# Patient Record
Sex: Male | Born: 1960 | Race: White | Hispanic: No | Smoking: Current every day smoker
Health system: Southern US, Community
[De-identification: ages and names within clinical notes are randomized; demographics above are authoritative.]

## PROBLEM LIST (undated history)

## (undated) DIAGNOSIS — E119 Type 2 diabetes mellitus without complications: Secondary | ICD-10-CM

## (undated) DIAGNOSIS — H269 Unspecified cataract: Secondary | ICD-10-CM

## (undated) DIAGNOSIS — L732 Hidradenitis suppurativa: Secondary | ICD-10-CM

## (undated) DIAGNOSIS — R011 Cardiac murmur, unspecified: Secondary | ICD-10-CM

## (undated) DIAGNOSIS — I10 Essential (primary) hypertension: Secondary | ICD-10-CM

## (undated) DIAGNOSIS — G4730 Sleep apnea, unspecified: Secondary | ICD-10-CM

## (undated) HISTORY — DX: Hidradenitis suppurativa: L73.2

## (undated) HISTORY — DX: Cardiac murmur, unspecified: R01.1

## (undated) HISTORY — DX: Sleep apnea, unspecified: G47.30

## (undated) HISTORY — PX: CARDIAC CATHETERIZATION: SHX172

## (undated) HISTORY — DX: Unspecified cataract: H26.9

---

## 2001-11-04 ENCOUNTER — Emergency Department (HOSPITAL_COMMUNITY): Admission: EM | Admit: 2001-11-04 | Discharge: 2001-11-05 | Payer: Self-pay | Admitting: Emergency Medicine

## 2001-11-05 ENCOUNTER — Encounter: Payer: Self-pay | Admitting: Emergency Medicine

## 2004-06-24 ENCOUNTER — Inpatient Hospital Stay (HOSPITAL_COMMUNITY): Admission: EM | Admit: 2004-06-24 | Discharge: 2004-06-24 | Payer: Self-pay | Admitting: Emergency Medicine

## 2006-04-04 ENCOUNTER — Emergency Department (HOSPITAL_COMMUNITY): Admission: EM | Admit: 2006-04-04 | Discharge: 2006-04-04 | Payer: Self-pay | Admitting: Emergency Medicine

## 2007-01-18 IMAGING — CR DG NECK SOFT TISSUE
2 series · 2 of 2 positions shown · non-contrast
Comparison: none

CLINICAL DATA: Left jaw pain and swelling.
 SOFT TISSUE NECK ? 2 VIEWS:

[w soft tissue neck * (1 of 2)]
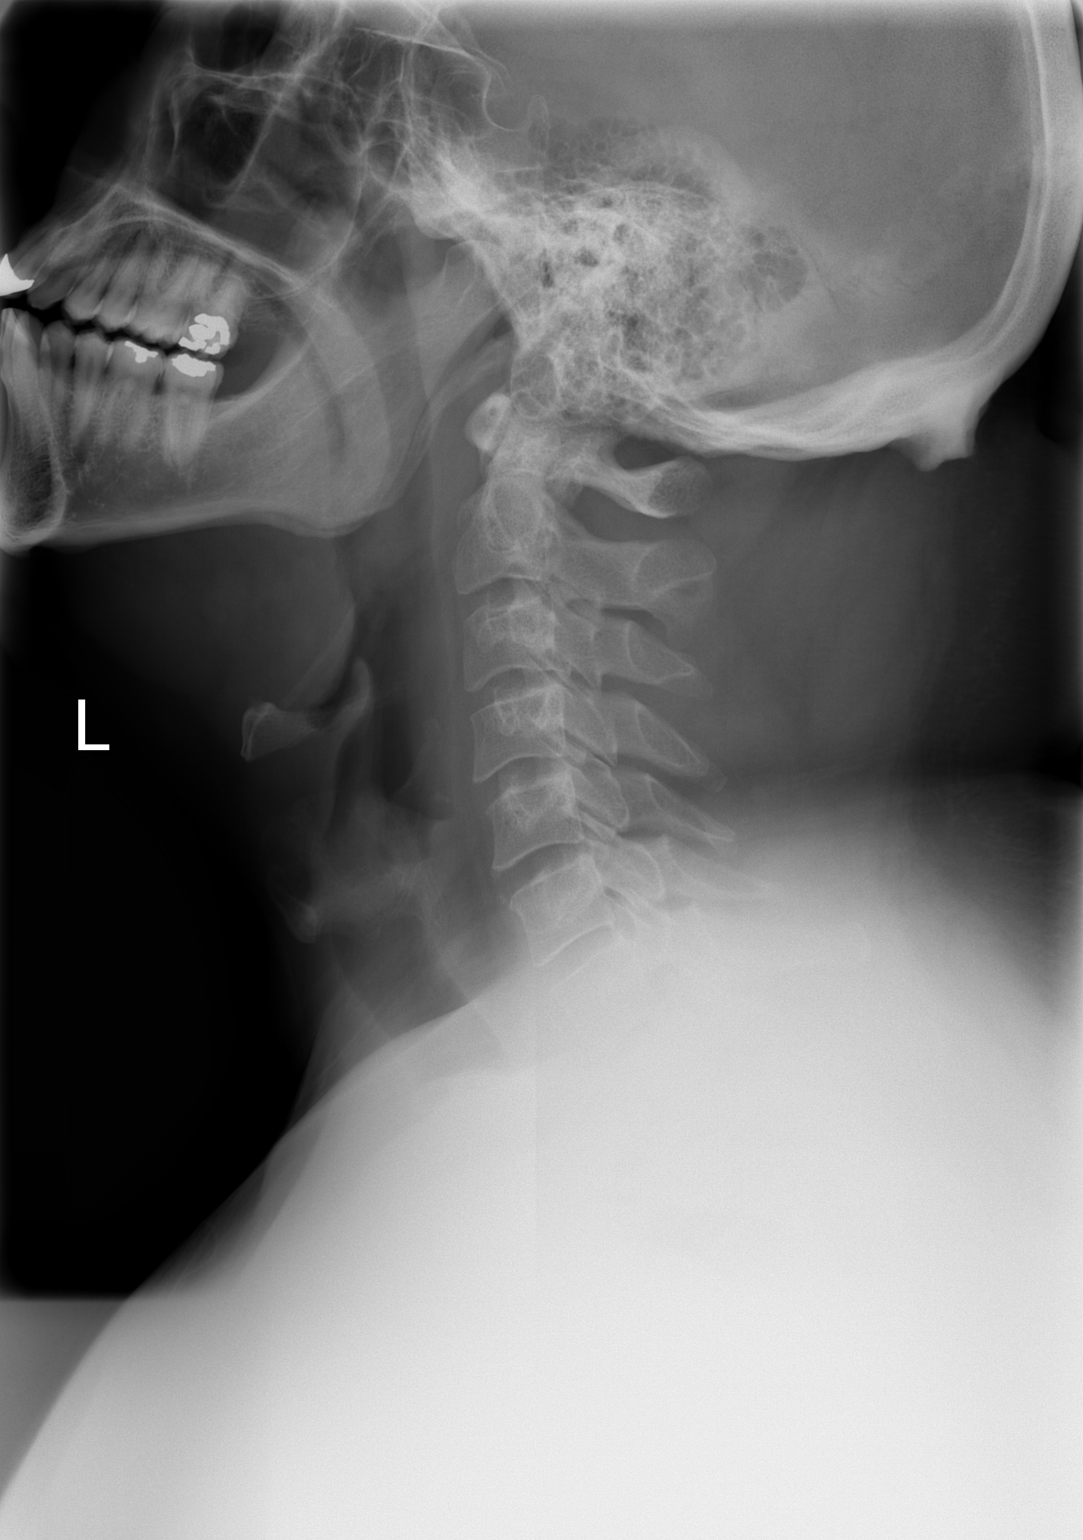

[w soft tissue neck * (2 of 2)]
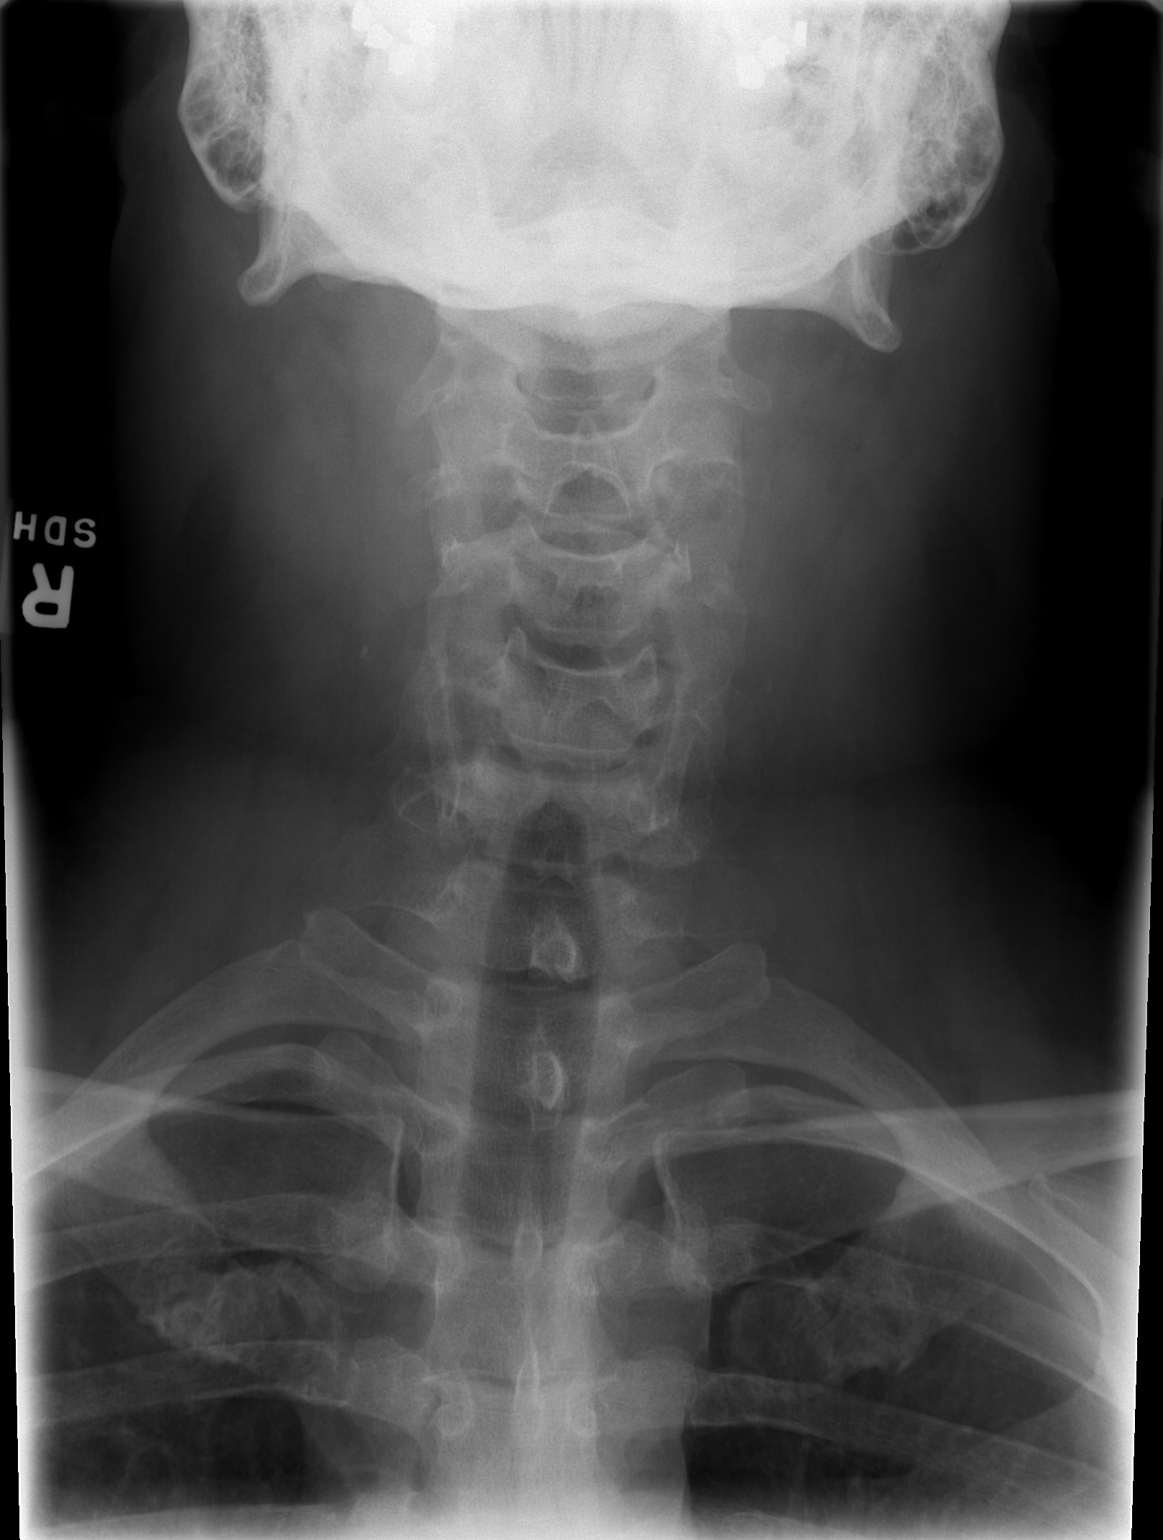

[2 of 2 positions shown; findings below may reference images not displayed]

FINDINGS: The lateral radiograph of the mandible is normal.  The oropharynx airway is patent.  The prevertebral soft tissues are within normal limits.
IMPRESSION: Normal soft tissue radiographs of neck.

## 2007-12-10 ENCOUNTER — Emergency Department (HOSPITAL_COMMUNITY): Admission: EM | Admit: 2007-12-10 | Discharge: 2007-12-10 | Payer: Self-pay | Admitting: Family Medicine

## 2011-05-08 NOTE — H&P (Signed)
NAMERAYSHAWN, MANEY                    ACCOUNT NO.:  [id number]   MEDICAL RECORD NO.:  [id number]                   PATIENT TYPE:  INP   LOCATION:  2002                                 FACILITY:  [facility]   PHYSICIAN:  Merlene Laughter. Renae Gloss, M.D.           DATE OF BIRTH:  [birth year]   DATE OF ADMISSION:  06/23/2004  DATE OF DISCHARGE:                                HISTORY & PHYSICAL   CHIEF COMPLAINT:  Chest pain.   HISTORY OF PRESENT ILLNESS:  Mr. Vultaggio is a 50 year old gentleman who  presents with a several-hour history of progressively worsening chest pain  which he describes as mid sternal and radiating to the groin.  He also  complains of diaphoresis and shortness of breath.  He denies palpitations,  nausea, vomiting.  He has no other acute constitutional or systemic  complaints at this time.   ALLERGIES:  No known drug allergies.   MEDICATIONS:  1. Lipitor 40 mg p.o. daily.  2. Restoril 40 mg p.o. q.h.s. p.r.n.  3. Celexa 40 mg p.o. daily.  4. Lotrel 5/10 p.o. daily.  5. Amaryl 4 mg p.o. daily.  6. Meclizine 25 mg p.r.n.   PAST MEDICAL HISTORY:  1. Type 2 diabetes mellitus.  2. Hypertension.  3. Hyperlipidemia.  4. Depression.  5. Insomnia.   SOCIAL HISTORY:  Mr. Tanori is married. 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  He  does report smoking 2 pack of cigarettes a day.  He denies alcohol or drug  abuse.   FAMILY HISTORY:  Significant for aortic aneurysm.   REVIEW OF SYSTEMS:  As per history of present illness and patient's history  assessment.   PHYSICAL EXAMINATION:  GENERAL:  well-developed, well-nourished white male  in no acute distress.  VITAL SIGNS:  Blood pressure 140/72, temperature 98.3, pulse 74,  respirations 24, O2 saturations on room air 100%.  HEENT:  Tympanic membranes within normal limits bilaterally.  No  oropharyngeal lesions.  NECK:  Supple, no masses, tenderness, no carotid bruits.  LUNGS:  Clear to auscultation bilaterally.  HEART:   S1, S2 regular rate and rhythm, no murmurs, rubs, or gallops.  ABDOMEN:  Soft, nontender, nondistended, positive bowel sounds.  EXTREMITIES:  No clubbing, cyanosis or edema.  NEUROLOGIC:  Alert and oriented x3.  Cranial nerves intact.   ASSESSMENT AND PLAN:  1. Chest pain.  Given Mr. Korus's cardiac risk factors including     hypertension, diabetes, and hyperlipidemia, he will be admitted to rule     out for cardiac etiology.  His first set of cardiac enzymes is negative.     Other possible etiologies would include acid peptic disease.   1. Hypertension, type 2 diabetes mellitus, and hyperlipidemia.  Will     continue current regimen at this time, these problems appear to be     stable.  Merlene Laughter. Renae Gloss, M.D.    KRS/MEDQ  D:  06/24/2004  T:  06/24/2004  Job:  [id number]

## 2011-05-08 NOTE — Cardiovascular Report (Signed)
NAMEGUERIN, Stephen                    ACCOUNT NO.:  [id number]   MEDICAL RECORD NO.:  [id number]                   PATIENT TYPE:  INP   LOCATION:  2099                                 FACILITY:  [facility]   PHYSICIAN:  Madaline Savage, M.D.             DATE OF BIRTH:  Oct 24, 1961   DATE OF PROCEDURE:  06/24/2004  DATE OF DISCHARGE:                              CARDIAC CATHETERIZATION   PROCEDURES PERFORMED:  1. Selective coronary angiography by Judkins technique.  2. Retrograde left heart catheterization.  3. Left ventricular angiography.  4. Abdominal aortography.  5. Successful Angioseal right femoral artery percutaneous closure.   COMPLICATIONS:  None.   ENTRY SITE:  Right femoral.   DYE USED:  Omnipaque.   PATIENT PROFILE:  The patient is a 50 year old gentleman with chest pain  sounding anginal that caused his presentation and subsequent admission to  [facility] in the early morning hours of June 24, 2004.  A  cardiology consult was obtained and we identified at least five risk factors  for having obstructive coronary disease, which included diabetes type 2,  hypertension, hyperlipidemia, obstructive sleep apnea, and tobacco use.  Because of the patient's family history with mother, grandmother,  grandfather all having obstructive coronary disease and myocardial  infarction, it was decided to perform cardiac catheterization as a  definitive evaluation of his chest pain.  This was performed electively on  the afternoon of June 24, 2004, at approximately 4:20 p.m., and was performed  without complications.  The results are listed below.   RESULTS:   PRESSURES:  1. Left ventricular pressure was 125/12, end-diastolic pressure 23.  2. Central aortic pressure 125/85, mean of 105.  3. No aortic valve gradient by pullback technique.   ANGIOGRAPHIC RESULTS:  1. The left main coronary artery is normal.  2. The left anterior descending coronary artery courses to  the cardiac apex,     giving rise to a septal perforator branch, followed by a first diagonal     and then a second diagonal further down the vessel.  No lesions were seen     in the entirety of the LAD or its branches.  3. The left circumflex was nondominant.  It was comprised of an intermediate     ramus branch coming off the left main coronary artery, which was normal,     and the circumflex gave rise to an atrial circumflex branch, then an     obtuse marginal branch, and terminated as a small posterolateral branch.     No lesions were seen in the entirety of the left circumflex system or its     branches.  4. The right coronary artery had an inferior takeoff and was large and     dominant.  It gave rise to a medium-sized posterior descending branch and     a large-size posterolateral branch, and all vessels in the RCA system     were normal.  5.  Left ventricular angiography in an RAO projection 30 degrees showed a     normally-contracting left ventricular chamber with no wall motion     abnormalities, no evidence of mitral regurgitation, and an ejection     fraction estimate of 60%.  No LV thrombus was seen.  6. Abdominal aortography showed both renal arteries to be single and normal.     The abdominal aorta was smooth without any aneurysmal dilatation and the     common iliacs appeared normal as well.  After a 32-degree RAO angiogram     of the right femoral artery showing good entry into the vessel, an     Angioseal Perclose arteriotomy was performed without incidents, with     excellent hemostasis.   The patient tolerated this procedure very well after being given Versed 1 mg  IV and 25 mcg of fentanyl for sedation.  The results as listed above were  favorable to the patient, and he exited the catheterization lab #5 with  stable vital signs and was alert and oriented.   FINAL DIAGNOSES:  1. Recent chest pain with multiple cardiac risk factors.  2. Angiographically patent  coronary arteries.  3. Normal left ventricular systolic function, ejection fraction 60%.  4. Normal renal arteries.  5. Normal abdominal aorta.  6. Successful right percutaneous Angioseal closure.   PLAN:  Discharge in two hours according to Dr. Loney Laurence direction.  See [place]  again in a week or two for a groin check.  Follow up with Dr. Kern Reap.                                               Madaline Savage, M.D.    WHG/MEDQ  D:  06/24/2004  T:  06/25/2004  Job:  [id number]   cc:   Olene Craven, M.D.  [address]  [address]  [place]  [place] [address]  Fax: [phone]   [facility]

## 2015-04-14 ENCOUNTER — Emergency Department (INDEPENDENT_AMBULATORY_CARE_PROVIDER_SITE_OTHER)
Admission: EM | Admit: 2015-04-14 | Discharge: 2015-04-14 | Disposition: A | Discharge status: Nursing Home (Includes ICF) | Source: Home / Self Care | Attending: Emergency Medicine | Admitting: Emergency Medicine

## 2015-04-14 ENCOUNTER — Encounter (HOSPITAL_COMMUNITY): Payer: Self-pay | Admitting: *Deleted

## 2015-04-14 ENCOUNTER — Encounter (HOSPITAL_COMMUNITY): Payer: Self-pay

## 2015-04-14 ENCOUNTER — Emergency Department (HOSPITAL_COMMUNITY)

## 2015-04-14 ENCOUNTER — Emergency Department (HOSPITAL_COMMUNITY)
Admission: EM | Admit: 2015-04-14 | Discharge: 2015-04-14 | Disposition: A | Discharge status: Home / Self Care | Attending: Emergency Medicine | Admitting: Emergency Medicine

## 2015-04-14 DIAGNOSIS — Z792 Long term (current) use of antibiotics: Secondary | ICD-10-CM | POA: Insufficient documentation

## 2015-04-14 DIAGNOSIS — Z794 Long term (current) use of insulin: Secondary | ICD-10-CM | POA: Insufficient documentation

## 2015-04-14 DIAGNOSIS — E119 Type 2 diabetes mellitus without complications: Secondary | ICD-10-CM | POA: Insufficient documentation

## 2015-04-14 DIAGNOSIS — L0291 Cutaneous abscess, unspecified: Secondary | ICD-10-CM

## 2015-04-14 DIAGNOSIS — L0231 Cutaneous abscess of buttock: Secondary | ICD-10-CM | POA: Diagnosis not present

## 2015-04-14 DIAGNOSIS — Z79899 Other long term (current) drug therapy: Secondary | ICD-10-CM | POA: Diagnosis not present

## 2015-04-14 DIAGNOSIS — I10 Essential (primary) hypertension: Secondary | ICD-10-CM | POA: Insufficient documentation

## 2015-04-14 DIAGNOSIS — L03317 Cellulitis of buttock: Secondary | ICD-10-CM

## 2015-04-14 DIAGNOSIS — Z9889 Other specified postprocedural states: Secondary | ICD-10-CM | POA: Insufficient documentation

## 2015-04-14 HISTORY — DX: Type 2 diabetes mellitus without complications: E11.9

## 2015-04-14 HISTORY — DX: Essential (primary) hypertension: I10

## 2015-04-14 LAB — COMPREHENSIVE METABOLIC PANEL
ALT: 18 U/L (ref 0–53)
AST: 15 U/L (ref 0–37)
Albumin: 3.6 g/dL (ref 3.5–5.2)
Alkaline Phosphatase: 77 U/L (ref 39–117)
Anion gap: 11 (ref 5–15)
BILIRUBIN TOTAL: 1.6 mg/dL — AB (ref 0.3–1.2)
BUN: 11 mg/dL (ref 6–23)
CALCIUM: 8.9 mg/dL (ref 8.4–10.5)
CHLORIDE: 99 mmol/L (ref 96–112)
CO2: 22 mmol/L (ref 19–32)
Creatinine, Ser: 0.98 mg/dL (ref 0.50–1.35)
Glucose, Bld: 195 mg/dL — ABNORMAL HIGH (ref 70–99)
Potassium: 3.8 mmol/L (ref 3.5–5.1)
Sodium: 132 mmol/L — ABNORMAL LOW (ref 135–145)
Total Protein: 7.5 g/dL (ref 6.0–8.3)

## 2015-04-14 LAB — CBC WITH DIFFERENTIAL/PLATELET
Basophils Absolute: 0 10*3/uL (ref 0.0–0.1)
Basophils Relative: 0 % (ref 0–1)
EOS ABS: 0 10*3/uL (ref 0.0–0.7)
Eosinophils Relative: 0 % (ref 0–5)
HCT: 45 % (ref 39.0–52.0)
Hemoglobin: 15.4 g/dL (ref 13.0–17.0)
Lymphocytes Relative: 7 % — ABNORMAL LOW (ref 12–46)
Lymphs Abs: 1.1 10*3/uL (ref 0.7–4.0)
MCH: 28.8 pg (ref 26.0–34.0)
MCHC: 34.2 g/dL (ref 30.0–36.0)
MCV: 84.1 fL (ref 78.0–100.0)
MONOS PCT: 10 % (ref 3–12)
Monocytes Absolute: 1.6 10*3/uL — ABNORMAL HIGH (ref 0.1–1.0)
Neutro Abs: 13.6 10*3/uL — ABNORMAL HIGH (ref 1.7–7.7)
Neutrophils Relative %: 83 % — ABNORMAL HIGH (ref 43–77)
PLATELETS: 169 10*3/uL (ref 150–400)
RBC: 5.35 MIL/uL (ref 4.22–5.81)
RDW: 13.3 % (ref 11.5–15.5)
WBC: 16.3 10*3/uL — AB (ref 4.0–10.5)

## 2015-04-14 MED ORDER — LIDOCAINE-EPINEPHRINE 1 %-1:100000 IJ SOLN
10.0000 mL | Freq: Once | INTRAMUSCULAR | Status: AC
  Administered 2015-04-14: 10 mL
  Filled 2015-04-14: qty 1

## 2015-04-14 MED ORDER — LIDOCAINE-EPINEPHRINE (PF) 1 %-1:200000 IJ SOLN: 10.0000 mL | Freq: Once | INTRAMUSCULAR | Status: DC

## 2015-04-14 MED ORDER — CEFAZOLIN SODIUM-DEXTROSE 2-3 GM-% IV SOLR
2.0000 g | Freq: Once | INTRAVENOUS | Status: AC
  Administered 2015-04-14: 2 g via INTRAVENOUS
  Filled 2015-04-14: qty 50

## 2015-04-14 MED ORDER — AMOXICILLIN-POT CLAVULANATE 875-125 MG PO TABS: 1.0000 | ORAL_TABLET | Freq: Two times a day (BID) | ORAL | Status: DC

## 2015-04-14 MED ORDER — HYDROCODONE-ACETAMINOPHEN 5-325 MG PO TABS: 1.0000 | ORAL_TABLET | ORAL | Status: DC | PRN

## 2015-04-14 MED ORDER — ACETAMINOPHEN 325 MG PO TABS
650.0000 mg | ORAL_TABLET | Freq: Four times a day (QID) | ORAL | Status: DC | PRN
  Administered 2015-04-14: 650 mg via ORAL
  Filled 2015-04-14: qty 2

## 2015-04-14 NOTE — Discharge Instructions (Signed)
Soak, and remove the gauze in 24-36 hours. Continue soaks twice per day with gentle massage of the abscess area. Wound recheck in 2-3 days. Recheck here with any worsening symptoms, worsening pain, fever, worsening sugars.  Abscess An abscess is an infected area that contains a collection of pus and debris.It can occur in almost any part of the body. An abscess is also known as a furuncle or boil. CAUSES  An abscess occurs when tissue gets infected. This can occur from blockage of oil or sweat glands, infection of hair follicles, or a minor injury to the skin. As the body tries to fight the infection, pus collects in the area and creates pressure under the skin. This pressure causes pain. People with weakened immune systems have difficulty fighting infections and get certain abscesses more often.  SYMPTOMS Usually an abscess develops on the skin and becomes a painful mass that is red, warm, and tender. If the abscess forms under the skin, you may feel a moveable soft area under the skin. Some abscesses break open (rupture) on their own, but most will continue to get worse without care. The infection can spread deeper into the body and eventually into the bloodstream, causing you to feel ill.  DIAGNOSIS  Your caregiver will take your medical history and perform a physical exam. A sample of fluid may also be taken from the abscess to determine what is causing your infection. TREATMENT  Your caregiver may prescribe antibiotic medicines to fight the infection. However, taking antibiotics alone usually does not cure an abscess. Your caregiver may need to make a small cut (incision) in the abscess to drain the pus. In some cases, gauze is packed into the abscess to reduce pain and to continue draining the area. HOME CARE INSTRUCTIONS   Only take over-the-counter or prescription medicines for pain, discomfort, or fever as directed by your caregiver.  If you were prescribed antibiotics, take them as  directed. Finish them even if you start to feel better.  If gauze is used, follow your caregiver's directions for changing the gauze.  To avoid spreading the infection:  Keep your draining abscess covered with a bandage.  Wash your hands well.  Do not share personal care items, towels, or whirlpools with others.  Avoid skin contact with others.  Keep your skin and clothes clean around the abscess.  Keep all follow-up appointments as directed by your caregiver. SEEK MEDICAL CARE IF:   You have increased pain, swelling, redness, fluid drainage, or bleeding.  You have muscle aches, chills, or a general ill feeling.  You have a fever. MAKE SURE YOU:   Understand these instructions.  Will watch your condition.  Will get help right away if you are not doing well or get worse. Document Released: 09/16/2005 Document Revised: 06/07/2012 Document Reviewed: 02/19/2012 [facility] Patient Information 2015 [place], [place]. This information is not intended to replace advice given to you by your health care provider. Make sure you discuss any questions you have with your health care provider.

## 2015-04-14 NOTE — ED Provider Notes (Signed)
CSN: [id number]     Arrival date & time 04/14/15  1051 History   First MD Initiated Contact with Patient 04/14/15 1317     Chief Complaint  Patient presents with  . Rash   (Consider location/radiation/quality/duration/timing/severity/associated sxs/prior Treatment) HPI  He is a 54 year old man here for evaluation of abscess. He states he has had a pimple on his right buttock for the last 6 months. He will intermittently expressed pus from this pimple. He states he was expressing pus on Friday and he felt it rupture internally. Since then, he has had redness, swelling, tenderness. He has felt feverish. No nausea. He is a diabetic and his blood sugar is starting to go up.  Past Medical History  Diagnosis Date  . Hypertension   . Diabetes mellitus without complication    Past Surgical History  Procedure Laterality Date  . Cardiac catheterization     History reviewed. No pertinent family history. History  Substance Use Topics  . Smoking status: Current Every Day Smoker  . Smokeless tobacco: Not on file  . Alcohol Use: Yes    Review of Systems As in history of present illness Allergies  Review of patient's allergies indicates not on file.  Home Medications   Prior to Admission medications   Medication Sig Start Date End Date Taking? Authorizing Provider  atorvastatin (LIPITOR) 40 MG tablet Take 40 mg by mouth daily.   Yes Historical Provider, MD  Insulin Glargine (LANTUS [place]) Inject into the skin.   Yes Historical Provider, MD  Insulin Lispro, Human, (HUMALOG [place]) Inject into the skin.   Yes Historical Provider, MD  LISINOPRIL PO Take by mouth.   Yes Historical Provider, MD  sitaGLIPtin-metformin (JANUMET) 50-1000 MG per tablet Take 1 tablet by mouth 2 (two) times daily with a meal.   Yes Historical Provider, MD   BP 178/88 mmHg  Pulse 119  Temp(Src) 101.6 F (38.7 C) (Oral)  Resp 16  SpO2 99% Physical Exam  Constitutional: He is oriented to person, place, and time. He  appears well-developed and well-nourished. No distress.  Cardiovascular: Normal rate.   Pulmonary/Chest: Effort normal.  Genitourinary:     Neurological: He is alert and oriented to person, place, and time.  Skin:  Right buttock with erythema and induration extending to the perineum and rectal area. There is a central area of fluctuance.    ED Course  Procedures (including critical care time) Labs Review Labs Reviewed - No data to display  Imaging Review No results found.   MDM   1. Abscess of buttock, right   2. Cellulitis of buttock    With tachycardia and fever, he technically meets criteria for sepsis. He is also a known diabetic and his sugars are going up. He states it was 170 this morning. We'll transfer to [facility] ER via shuttle for additional evaluation and workup. I think he needs at least 1 dose of IV antibiotics.    Melony Overly, MD 04/14/15 1341

## 2015-04-14 NOTE — ED Notes (Signed)
Pt sent from u/c abscess and cellulitis right buttock.  Pt reports onset 6 months pimple right buttock  Pt sometimes expresses pus from pimple and sometimes it will drain on its own.  2 days ago pimple ruptured internally now site red, swollen and tender.  Pt reports fevers.  BS has been increased.

## 2015-04-14 NOTE — ED Notes (Signed)
Pt  Reports   Pain in  Buttock   Rectal  Area   With  Redness   Swelling     Had  Some  Drainage   Earlier     Pain is  Present  As    Well       Pt  Has  A  History   Of  Diabetes     He  Also has  A  Fever  He  States  His  Glucose  Was  Slightly  Elevated  Today  As   Well      -  Pt ambulated  To  Room    He  Is  Awake  And  Alert  And  Oriented

## 2015-04-14 NOTE — ED Provider Notes (Signed)
CSN: [id number]     Arrival date & time 04/14/15  1414 History   First MD Initiated Contact with Patient 04/14/15 1732     Chief Complaint  Patient presents with  . Cellulitis      HPI  Impression presents for evaluation of a buttock abscess. Seen and evaluated urgent care and transferred here. History of diabetes. States her sugars of "out-of-control" they're 170 today. States he intermittently gets drainage from a "boil" on his right buttock. Started draining yesterday but felt like it was "getting bigger inside".  Past Medical History  Diagnosis Date  . Hypertension   . Diabetes mellitus without complication    Past Surgical History  Procedure Laterality Date  . Cardiac catheterization     History reviewed. No pertinent family history. History  Substance Use Topics  . Smoking status: Current Every Day Smoker -- 1.00 packs/day    Types: Cigarettes  . Smokeless tobacco: Not on file  . Alcohol Use: Yes     Comment: occ     Review of Systems  Constitutional: Negative for fever, chills, diaphoresis, appetite change and fatigue.  HENT: Negative for mouth sores, sore throat and trouble swallowing.   Eyes: Negative for visual disturbance.  Respiratory: Negative for cough, chest tightness, shortness of breath and wheezing.   Cardiovascular: Negative for chest pain.  Gastrointestinal: Negative for nausea, vomiting, abdominal pain, diarrhea and abdominal distention.  Endocrine: Negative for polydipsia, polyphagia and polyuria.  Genitourinary: Negative for dysuria, frequency and hematuria.       Right gluteal pain.  Musculoskeletal: Negative for gait problem.  Skin: Negative for color change, pallor and rash.  Neurological: Negative for dizziness, syncope, light-headedness and headaches.  Hematological: Does not bruise/bleed easily.  Psychiatric/Behavioral: Negative for behavioral problems and confusion.      Allergies  Review of patient's allergies indicates no known  allergies.  Home Medications   Prior to Admission medications   Medication Sig Start Date End Date Taking? Authorizing Provider  atorvastatin (LIPITOR) 40 MG tablet Take 40 mg by mouth daily.   Yes Historical Provider, MD  insulin glargine (LANTUS) 100 UNIT/ML injection Inject 25 Units into the skin at bedtime.   Yes Historical Provider, MD  Insulin Lispro, Human, (HUMALOG [place]) Inject 15 units of lipase into the skin See admin instructions. Per sliding scale   Yes Historical Provider, MD  lisinopril (PRINIVIL,ZESTRIL) 20 MG tablet Take 20 mg by mouth daily.   Yes Historical Provider, MD  sitaGLIPtin-metformin (JANUMET) 50-1000 MG per tablet Take 1 tablet by mouth 2 (two) times daily with a meal.   Yes Historical Provider, MD  temazepam (RESTORIL) 15 MG capsule Take 15 mg by mouth at bedtime as needed for sleep.   Yes Historical Provider, MD  amoxicillin-clavulanate (AUGMENTIN) 875-125 MG per tablet Take 1 tablet by mouth 2 (two) times daily. 04/14/15   Tanna Furry, MD  HYDROcodone-acetaminophen (NORCO/VICODIN) 5-325 MG per tablet Take 1 tablet by mouth every 4 (four) hours as needed. 04/14/15   Tanna Furry, MD   BP 131/80 mmHg  Pulse 102  Temp(Src) 98.5 F (36.9 C) (Oral)  Resp 16  Ht 6' (1.829 m)  Wt 247 lb 11.2 oz (112.356 kg)  BMI 33.59 kg/m2  SpO2 96% Physical Exam  Constitutional: He is oriented to person, place, and time. He appears well-developed and well-nourished. No distress.  HENT:  Head: Normocephalic.  Eyes: Conjunctivae are normal. Pupils are equal, round, and reactive to light. No scleral icterus.  Neck: Normal range of  motion. Neck supple. No thyromegaly present.  Cardiovascular: Normal rate and regular rhythm.  Exam reveals no gallop and no friction rub.   No murmur heard. Pulmonary/Chest: Effort normal and breath sounds normal. No respiratory distress. He has no wheezes. He has no rales.  Abdominal: Soft. Bowel sounds are normal. He exhibits no distension. There is no  tenderness. There is no rebound.  Genitourinary:     Musculoskeletal: Normal range of motion.  Neurological: He is alert and oriented to person, place, and time.  Skin: Skin is warm and dry. No rash noted.  Psychiatric: He has a normal mood and affect. His behavior is normal.    ED Course  Procedures (including critical care time) Labs Review Labs Reviewed  CBC WITH DIFFERENTIAL/PLATELET - Abnormal; Notable for the following:    WBC 16.3 (*)    Neutrophils Relative % 83 (*)    Neutro Abs 13.6 (*)    Lymphocytes Relative 7 (*)    Monocytes Absolute 1.6 (*)    All other components within normal limits  COMPREHENSIVE METABOLIC PANEL - Abnormal; Notable for the following:    Sodium 132 (*)    Glucose, Bld 195 (*)    Total Bilirubin 1.6 (*)    All other components within normal limits  CULTURE, BLOOD (ROUTINE X 2)  CULTURE, BLOOD (ROUTINE X 2)  URINE CULTURE  URINALYSIS, ROUTINE W REFLEX MICROSCOPIC    Imaging Review Dg Chest 2 View  04/14/2015   CLINICAL DATA:  Cellulitis  EXAM: CHEST  2 VIEW  COMPARISON:  06/23/2004  FINDINGS: The heart size and mediastinal contours are within normal limits. Both lungs are clear. The visualized skeletal structures are unremarkable.  IMPRESSION: No active cardiopulmonary disease.   Electronically Signed   By: Franchot Gallo M.D.   On: 04/14/2015 16:13     EKG Interpretation None     INCISION AND DRAINAGE Performed by: Lolita Patella Consent: Verbal consent obtained. Risks and benefits: risks, benefits and alternatives were discussed Type: abscess  Body area: Right buttock  Anesthesia: local infiltration  Incision was made with a scalpel.  Local anesthetic: lidocaine 1% with epinephrine  Anesthetic total: 6 ml  Complexity: complex Blunt dissection to break up loculations  Drainage: purulent  Drainage amount: moderate, 10 ml  Packing material: 1/4 in iodoform gauze  Patient tolerance: Patient tolerated the procedure  well with no immediate complications.    MDM   Final diagnoses:  Abscess    Given IV Ancef. Incision and drainage performed. Iodoform gauze placed. Re-dressed. Plan is discharged home. I have asked him to soak, and remove the gauze in 24-48 hours. Recheck here in 2-3 days if not markedly improving. Recheck with any worsening in the interval. Including worsening pain, fevers, uncontrolled sugars.    Tanna Furry, MD 04/14/15 [place]ius

## 2015-04-20 LAB — CULTURE, BLOOD (ROUTINE X 2): Culture: NO GROWTH

## 2015-04-21 LAB — CULTURE, BLOOD (ROUTINE X 2): Culture: NO GROWTH

## 2018-07-25 LAB — LIPID PANEL
CHOLESTEROL: 109 (ref 0–200)
HDL: 27 — AB (ref 35–70)
LDL Cholesterol: 63
LDl/HDL Ratio: 2.3
Triglycerides: 97 (ref 40–160)

## 2018-07-25 LAB — CBC AND DIFFERENTIAL
HCT: 45 (ref 41–53)
Hemoglobin: 15.3 (ref 13.5–17.5)
NEUTROS ABS: 5
Platelets: 215 (ref 150–399)
WBC: 7.7

## 2018-07-25 LAB — HEPATIC FUNCTION PANEL
ALT: 26 (ref 10–40)
AST: 20 (ref 14–40)
Alkaline Phosphatase: 76 (ref 25–125)
Bilirubin, Total: 0.3

## 2018-07-25 LAB — BASIC METABOLIC PANEL
BUN: 12 (ref 4–21)
CREATININE: 0.9 (ref 0.6–1.3)
GLUCOSE: 115
POTASSIUM: 4.5 (ref 3.4–5.3)
SODIUM: 144 (ref 137–147)

## 2018-07-25 LAB — VITAMIN D 25 HYDROXY (VIT D DEFICIENCY, FRACTURES): VIT D 25 HYDROXY: 19.8

## 2018-07-25 LAB — HEMOGLOBIN A1C: HEMOGLOBIN A1C: 6.6

## 2018-07-25 LAB — TSH: TSH: 1.9 (ref 0.41–5.90)

## 2018-10-16 ENCOUNTER — Other Ambulatory Visit: Payer: Self-pay | Admitting: Nurse Practitioner

## 2018-10-26 ENCOUNTER — Encounter: Payer: Self-pay | Admitting: Nurse Practitioner

## 2018-10-26 DIAGNOSIS — E1165 Type 2 diabetes mellitus with hyperglycemia: Secondary | ICD-10-CM

## 2018-10-26 DIAGNOSIS — E559 Vitamin D deficiency, unspecified: Secondary | ICD-10-CM

## 2018-10-26 DIAGNOSIS — E782 Mixed hyperlipidemia: Secondary | ICD-10-CM

## 2018-10-26 DIAGNOSIS — I10 Essential (primary) hypertension: Secondary | ICD-10-CM

## 2018-10-26 DIAGNOSIS — I6529 Occlusion and stenosis of unspecified carotid artery: Secondary | ICD-10-CM

## 2018-10-27 ENCOUNTER — Encounter: Payer: Self-pay | Admitting: Nurse Practitioner

## 2018-10-27 ENCOUNTER — Ambulatory Visit (INDEPENDENT_AMBULATORY_CARE_PROVIDER_SITE_OTHER): Admitting: Nurse Practitioner

## 2018-10-27 VITALS — BP 130/88 | HR 80 | Temp 98.1°F | Ht 72.0 in | Wt 249.2 lb

## 2018-10-27 DIAGNOSIS — E1165 Type 2 diabetes mellitus with hyperglycemia: Secondary | ICD-10-CM

## 2018-10-27 DIAGNOSIS — E782 Mixed hyperlipidemia: Secondary | ICD-10-CM | POA: Diagnosis not present

## 2018-10-27 DIAGNOSIS — I6529 Occlusion and stenosis of unspecified carotid artery: Secondary | ICD-10-CM | POA: Diagnosis not present

## 2018-10-27 DIAGNOSIS — Z23 Encounter for immunization: Secondary | ICD-10-CM | POA: Diagnosis not present

## 2018-10-27 DIAGNOSIS — I10 Essential (primary) hypertension: Secondary | ICD-10-CM | POA: Diagnosis not present

## 2018-10-27 DIAGNOSIS — Z794 Long term (current) use of insulin: Secondary | ICD-10-CM

## 2018-10-27 LAB — LIPID PANEL
CHOL/HDL RATIO: 3.4 ratio (ref 0.0–5.0)
CHOLESTEROL TOTAL: 93 mg/dL — AB (ref 100–199)
HDL: 27 mg/dL — ABNORMAL LOW (ref >39)
LDL CALC: 52 mg/dL (ref 0–99)
Triglycerides: 69 mg/dL (ref 0–149)
VLDL Cholesterol Cal: 14 mg/dL (ref 5–40)

## 2018-10-27 LAB — CMP14 + ANION GAP
ALT: 17 IU/L (ref 0–44)
AST: 13 IU/L (ref 0–40)
Albumin/Globulin Ratio: 1.6 (ref 1.2–2.2)
Albumin: 4.2 g/dL (ref 3.5–5.5)
Alkaline Phosphatase: 78 IU/L (ref 39–117)
Anion Gap: 14 mmol/L (ref 10.0–18.0)
BILIRUBIN TOTAL: 0.4 mg/dL (ref 0.0–1.2)
BUN/Creatinine Ratio: 9 (ref 9–20)
BUN: 9 mg/dL (ref 6–24)
CHLORIDE: 107 mmol/L — AB (ref 96–106)
CO2: 20 mmol/L (ref 20–29)
Calcium: 8.9 mg/dL (ref 8.7–10.2)
Creatinine, Ser: 1.03 mg/dL (ref 0.76–1.27)
GFR calc Af Amer: 92 mL/min/{1.73_m2} (ref >59)
GFR calc non Af Amer: 80 mL/min/{1.73_m2} (ref >59)
GLUCOSE: 75 mg/dL (ref 65–99)
Globulin, Total: 2.6 g/dL (ref 1.5–4.5)
Potassium: 4.5 mmol/L (ref 3.5–5.2)
Sodium: 141 mmol/L (ref 134–144)
TOTAL PROTEIN: 6.8 g/dL (ref 6.0–8.5)

## 2018-10-27 LAB — HEMOGLOBIN A1C
ESTIMATED AVERAGE GLUCOSE: 126 mg/dL
Hgb A1c MFr Bld: 6 % — ABNORMAL HIGH (ref 4.8–5.6)

## 2018-10-27 MED ORDER — TEMAZEPAM 15 MG PO CAPS: 15.0000 mg | ORAL_CAPSULE | Freq: Every evening | ORAL | 3 refills | Status: DC | PRN

## 2018-10-27 NOTE — Progress Notes (Addendum)
Subjective:     Patient ID: Stephen Sampson , male    DOB: 1961-04-19 , 57 y.o.   MRN: [id number]   Chief Complaint  Patient presents with  . Diabetes    HPI  Diabetes  He presents for his follow-up diabetic visit. He has type 2 diabetes mellitus. His disease course has been stable. Pertinent negatives for hypoglycemia include no dizziness or headaches. Pertinent negatives for diabetes include no chest pain, no polydipsia, no polyphagia and no polyuria. Symptoms are stable. Current diabetic treatment includes oral agent (dual therapy). He is compliant with treatment all of the time. His weight is stable. He is following a generally healthy diet. He participates in exercise daily. His home blood glucose trend is decreasing steadily. His breakfast blood glucose range is generally 70-90 mg/dl. An ACE inhibitor/angiotensin II receptor blocker is being taken. Eye exam is current.     Past Medical History:  Diagnosis Date  . Diabetes mellitus without complication ([place])   . Hypertension      No family history on file.   Current Outpatient Medications:  .  atorvastatin (LIPITOR) 40 MG tablet, TAKE 1 TABLET EVERY EVENING, Disp: 90 tablet, Rfl: 1 .  Dulaglutide (TRULICITY) 1.5 KD/3.2IZ SOPN, Inject into the skin. Inject 0.50ml by subcutaneous route every week in the abdomen, thigh or upper arm rotating injection sites, Disp: , Rfl:  .  insulin glargine (LANTUS) 100 UNIT/ML injection, Inject 25 Units into the skin at bedtime., Disp: , Rfl:  .  Insulin Lispro, Human, (HUMALOG [place]), Inject 15 units of lipase into the skin See admin instructions. Per sliding scale, Disp: , Rfl:  .  sitaGLIPtin-metformin (JANUMET) 50-1000 MG per tablet, Take 1 tablet by mouth 2 (two) times daily with a meal., Disp: , Rfl:  .  temazepam (RESTORIL) 15 MG capsule, Take 15 mg by mouth at bedtime as needed for sleep., Disp: , Rfl:  .  valsartan (DIOVAN) 160 MG tablet, Take 160 mg by mouth daily., Disp: , Rfl:     Allergies  Allergen Reactions  . Metformin And Related Diarrhea     Review of Systems  Constitutional: Negative.   Respiratory: Negative.  Negative for cough and shortness of breath.   Cardiovascular: Negative for chest pain and palpitations.  Endocrine: Negative for polydipsia, polyphagia and polyuria.  Musculoskeletal: Negative.   Neurological: Negative.  Negative for dizziness and headaches.     Today's Vitals   10/27/18 0844  BP: 130/88  Pulse: 80  Temp: 98.1 F (36.7 C)  TempSrc: Oral  SpO2: 94%  Weight: 249 lb 3.2 oz (113 kg)  Height: 6' (1.829 m)  PainSc: 0-No pain   Body mass index is 33.8 kg/m.   Objective:  Physical Exam  Constitutional: He is oriented to person, place, and time. He appears well-developed and well-nourished.  Cardiovascular: Normal rate and regular rhythm.  Murmur heard.  Systolic murmur is present with a grade of 2/6. Pulmonary/Chest: Effort normal and breath sounds normal.  Neurological: He is alert and oriented to person, place, and time.  Skin: Skin is warm and dry. Capillary refill takes less than 2 seconds.        Assessment And Plan:    1. Controlled type 2 diabetes mellitus with hyperglycemia, with long-term current use of insulin (HCC)  Chronic, controlled  Continue with current medications  Congratulated him on walking 1 mile daily over the last 3 months.  - Hemoglobin A1c  2. Essential hypertension  Chronic, fair control  Continue with  current medications - CMP14 + Anion Gap  3. Mixed hyperlipidemia  Chronic, controlled  Continue with current medications - Lipid Profile  4. Stenosis of carotid artery, unspecified laterality  Chronic, stable  Continue your follow up with cardiology  Continue with current medications  5. Need for influenza vaccination  Influenza vaccine administered  Encouraged to take Tylenol as needed for fever or muscle aches. - Flu Vaccine QUAD 6+ mos PF IM (Fluarix Quad PF)          [name], FNP

## 2018-12-01 LAB — HM DIABETES EYE EXAM

## 2018-12-09 ENCOUNTER — Encounter: Payer: Self-pay | Admitting: Nurse Practitioner

## 2019-01-24 ENCOUNTER — Other Ambulatory Visit: Payer: Self-pay | Admitting: Cardiology

## 2019-01-24 ENCOUNTER — Other Ambulatory Visit: Payer: Self-pay | Admitting: Nurse Practitioner

## 2019-01-25 ENCOUNTER — Encounter: Payer: Self-pay | Admitting: Nurse Practitioner

## 2019-01-25 ENCOUNTER — Ambulatory Visit (INDEPENDENT_AMBULATORY_CARE_PROVIDER_SITE_OTHER): Admitting: Nurse Practitioner

## 2019-01-25 VITALS — BP 132/88 | HR 74 | Temp 98.4°F | Ht 71.4 in | Wt 243.0 lb

## 2019-01-25 DIAGNOSIS — I10 Essential (primary) hypertension: Secondary | ICD-10-CM

## 2019-01-25 DIAGNOSIS — E782 Mixed hyperlipidemia: Secondary | ICD-10-CM

## 2019-01-25 DIAGNOSIS — E1165 Type 2 diabetes mellitus with hyperglycemia: Secondary | ICD-10-CM

## 2019-01-25 DIAGNOSIS — I6529 Occlusion and stenosis of unspecified carotid artery: Secondary | ICD-10-CM | POA: Diagnosis not present

## 2019-01-25 DIAGNOSIS — Z794 Long term (current) use of insulin: Secondary | ICD-10-CM

## 2019-01-25 DIAGNOSIS — Z716 Tobacco abuse counseling: Secondary | ICD-10-CM

## 2019-01-25 MED ORDER — INSULIN GLARGINE 100 UNIT/ML ~~LOC~~ SOLN: 48.0000 [IU] | Freq: Every day | SUBCUTANEOUS | 1 refills | Status: DC

## 2019-01-25 MED ORDER — VARENICLINE TARTRATE 0.5 MG X 11 & 1 MG X 42 PO MISC: ORAL | 0 refills | Status: DC

## 2019-01-25 NOTE — Progress Notes (Signed)
Subjective:     Patient ID: Stephen Sampson , male    DOB: 1961-03-10 , 58 y.o.   MRN: [id number]   Chief Complaint  Patient presents with  . Diabetes    HPI  Diabetes  He presents for his follow-up diabetic visit. He has type 2 diabetes mellitus. Pertinent negatives for hypoglycemia include no dizziness or headaches. Pertinent negatives for diabetes include no blurred vision and no chest pain. Symptoms are stable. Current diabetic treatment includes oral agent (dual therapy). His weight is decreasing steadily. He is following a generally healthy (trying to watch portions) diet. When asked about meal planning, he reported none. He has not had a previous visit with a dietitian. He participates in exercise intermittently. His overall blood glucose range is 110-130 mg/dl. An ACE inhibitor/angiotensin II receptor blocker is being taken. Eye exam is current.     Past Medical History:  Diagnosis Date  . Diabetes mellitus without complication ([place])   . Hypertension      No family history on file.   Current Outpatient Medications:  .  aspirin EC 81 MG tablet, Take 81 mg by mouth daily., Disp: , Rfl:  .  atorvastatin (LIPITOR) 40 MG tablet, TAKE 1 TABLET EVERY EVENING, Disp: 90 tablet, Rfl: 1 .  Dulaglutide (TRULICITY) 1.5 WP/8.0DX SOPN, Inject into the skin. Inject 0.45m by subcutaneous route every week in the abdomen, thigh or upper arm rotating injection sites, Disp: , Rfl:  .  insulin glargine (LANTUS) 100 UNIT/ML injection, Inject 25 Units into the skin at bedtime., Disp: , Rfl:  .  Insulin Lispro, Human, (HUMALOG [place]), Inject 15 units of lipase into the skin See admin instructions. Per sliding scale, Disp: , Rfl:  .  sitaGLIPtin-metformin (JANUMET) 50-1000 MG per tablet, Take 1 tablet by mouth 2 (two) times daily with a meal., Disp: , Rfl:  .  temazepam (RESTORIL) 15 MG capsule, Take 1 capsule (15 mg total) by mouth at bedtime as needed for sleep., Disp: 30 capsule, Rfl: 3 .   valsartan (DIOVAN) 160 MG tablet, TAKE 1 TABLET DAILY, Disp: 90 tablet, Rfl: 4   Allergies  Allergen Reactions  . Metformin And Related Diarrhea     Review of Systems  Eyes: Negative for blurred vision.  Respiratory: Negative.   Cardiovascular: Negative for chest pain, palpitations and leg swelling.  Musculoskeletal: Negative.   Neurological: Negative.  Negative for dizziness and headaches.     Today's Vitals   01/25/19 0912  BP: 132/88  Pulse: 74  Temp: 98.4 F (36.9 C)  TempSrc: Oral  SpO2: 96%  Weight: 243 lb (110.2 kg)  Height: 5' 11.4" (1.814 m)  PainSc: 0-No pain   Body mass index is 33.51 kg/m.   Objective:  Physical Exam Constitutional:      Appearance: Normal appearance.  Cardiovascular:     Rate and Rhythm: Normal rate and regular rhythm.     Pulses: Normal pulses.     Heart sounds: Normal heart sounds. No murmur.  Pulmonary:     Effort: Pulmonary effort is normal.     Breath sounds: Normal breath sounds.  Neurological:     Mental Status: He is alert.         Assessment And Plan:   1. Controlled type 2 diabetes mellitus with hyperglycemia, with long-term current use of insulin (HCC)  Chronic, improving, last HgbA1c was 6.0 and his glucose was 75  I have encouraged him once again to start titrating down with the Lantus because his blood  sugars are decreasing.  His blood sugar this morning was 115.  I have discussed the risk of hypoglycemia as well.    Encouraged to limit intake of sugary foods and drinks  Encouraged to increase physical activity to 150 minutes per week - Hemoglobin A1c - BMP8+eGFR  2. Essential hypertension . B/P is controlled.  . CMP ordered to check renal function.  . The importance of regular exercise and dietary modification was stressed to the patient.  . Stressed importance of losing ten percent of her body weight to help with B/P control.  . The weight loss would help with decreasing cardiac and cancer risk as well.   - BMP8+eGFR  3. Mixed hyperlipidemia  Chronic, controlled  Continue with current medications  4. Stenosis of carotid artery, unspecified laterality  Chronic, controlled  Continue with current medications  He has a follow up ECHO next month.    5. Encounter for tobacco use cessation counseling  Discussed setting a 12 week quit date  Discussed side effects to include vivid dreams which he has had before when taking Chantix   He is to call once he receives the starter pack for the continuing dose to be sent to the pharmacy - varenicline (CHANTIX STARTING MONTH PAK) 0.5 MG X 11 & 1 MG X 42 tablet; Take one 0.5 mg tablet by mouth once daily for 3 days, then increase to one 0.5 mg tablet twice daily for 4 days, then increase to one 1 mg tablet twice daily.  Dispense: 53 tablet; Refill: 0     [name], FNP

## 2019-01-25 NOTE — Patient Instructions (Signed)
Steps to Quit Smoking    Smoking tobacco can be bad for your health. It can also affect almost every organ in your body. Smoking puts you and people around you at risk for many serious long-lasting (chronic) diseases. Quitting smoking is hard, but it is one of the best things that you can do for your health. It is never too late to quit.  What are the benefits of quitting smoking?  When you quit smoking, you lower your risk for getting serious diseases and conditions. They can include:  · Lung cancer or lung disease.  · Heart disease.  · Stroke.  · Heart attack.  · Not being able to have children (infertility).  · Weak bones (osteoporosis) and broken bones (fractures).  If you have coughing, wheezing, and shortness of breath, those symptoms may get better when you quit. You may also get sick less often. If you are pregnant, quitting smoking can help to lower your chances of having a baby of low birth weight.  What can I do to help me quit smoking?  Talk with your doctor about what can help you quit smoking. Some things you can do (strategies) include:  · Quitting smoking totally, instead of slowly cutting back how much you smoke over a period of time.  · Going to in-person counseling. You are more likely to quit if you go to many counseling sessions.  · Using resources and support systems, such as:  ? Online chats with a counselor.  ? Phone quitlines.  ? Printed self-help materials.  ? Support groups or group counseling.  ? Text messaging programs.  ? Mobile phone apps or applications.  · Taking medicines. Some of these medicines may have nicotine in them. If you are pregnant or breastfeeding, do not take any medicines to quit smoking unless your doctor says it is okay. Talk with your doctor about counseling or other things that can help you.  Talk with your doctor about using more than one strategy at the same time, such as taking medicines while you are also going to in-person counseling. This can help make  quitting easier.  What things can I do to make it easier to quit?  Quitting smoking might feel very hard at first, but there is a lot that you can do to make it easier. Take these steps:  · Talk to your family and friends. Ask them to support and encourage you.  · Call phone quitlines, reach out to support groups, or work with a counselor.  · Ask people who smoke to not smoke around you.  · Avoid places that make you want (trigger) to smoke, such as:  ? Bars.  ? Parties.  ? Smoke-break areas at work.  · Spend time with people who do not smoke.  · Lower the stress in your life. Stress can make you want to smoke. Try these things to help your stress:  ? Getting regular exercise.  ? Deep-breathing exercises.  ? Yoga.  ? Meditating.  ? Doing a body scan. To do this, close your eyes, focus on one area of your body at a time from head to toe, and notice which parts of your body are tense. Try to relax the muscles in those areas.  · Download or buy apps on your mobile phone or tablet that can help you stick to your quit plan. There are many free apps, such as QuitGuide from the CDC (Centers for Disease Control and Prevention). You can find more   support from smokefree.gov and other websites.  This information is not intended to replace advice given to you by your health care provider. Make sure you discuss any questions you have with your health care provider.  Document Released: 10/03/2009 Document Revised: 08/04/2016 Document Reviewed: 04/23/2015  Elsevier Interactive Patient Education © 2019 [employer] Inc.

## 2019-01-26 LAB — BMP8+EGFR
BUN/Creatinine Ratio: 15 (ref 9–20)
BUN: 14 mg/dL (ref 6–24)
CO2: 20 mmol/L (ref 20–29)
Calcium: 9 mg/dL (ref 8.7–10.2)
Chloride: 107 mmol/L — ABNORMAL HIGH (ref 96–106)
Creatinine, Ser: 0.96 mg/dL (ref 0.76–1.27)
GFR calc Af Amer: 100 mL/min/{1.73_m2} (ref >59)
GFR calc non Af Amer: 87 mL/min/{1.73_m2} (ref >59)
Glucose: 109 mg/dL — ABNORMAL HIGH (ref 65–99)
Potassium: 4.8 mmol/L (ref 3.5–5.2)
Sodium: 140 mmol/L (ref 134–144)

## 2019-01-26 LAB — HEMOGLOBIN A1C
Est. average glucose Bld gHb Est-mCnc: 128 mg/dL
Hgb A1c MFr Bld: 6.1 % — ABNORMAL HIGH (ref 4.8–5.6)

## 2019-01-27 ENCOUNTER — Ambulatory Visit: Admitting: Nurse Practitioner

## 2019-01-31 ENCOUNTER — Other Ambulatory Visit: Payer: Self-pay | Admitting: Cardiology

## 2019-01-31 DIAGNOSIS — I350 Nonrheumatic aortic (valve) stenosis: Secondary | ICD-10-CM

## 2019-02-01 ENCOUNTER — Other Ambulatory Visit: Payer: Self-pay | Admitting: Nurse Practitioner

## 2019-02-01 ENCOUNTER — Encounter: Payer: Self-pay | Admitting: Nurse Practitioner

## 2019-02-01 DIAGNOSIS — Z716 Tobacco abuse counseling: Secondary | ICD-10-CM

## 2019-02-01 MED ORDER — VARENICLINE TARTRATE 0.5 MG X 11 & 1 MG X 42 PO MISC: ORAL | 0 refills | Status: DC

## 2019-02-02 ENCOUNTER — Other Ambulatory Visit: Payer: Self-pay | Admitting: Nurse Practitioner

## 2019-02-02 DIAGNOSIS — Z716 Tobacco abuse counseling: Secondary | ICD-10-CM

## 2019-02-02 MED ORDER — VARENICLINE TARTRATE 0.5 MG X 11 & 1 MG X 42 PO MISC: ORAL | 0 refills | Status: DC

## 2019-02-02 NOTE — Telephone Encounter (Signed)
Called to [facility] and was advised they have not received a request for Chantix, Rx request printed off and faxed. Patient made aware has been sent in.

## 2019-02-20 ENCOUNTER — Ambulatory Visit

## 2019-02-20 DIAGNOSIS — I350 Nonrheumatic aortic (valve) stenosis: Secondary | ICD-10-CM

## 2019-02-22 ENCOUNTER — Other Ambulatory Visit: Payer: Self-pay

## 2019-02-22 ENCOUNTER — Other Ambulatory Visit: Payer: Self-pay | Admitting: Nurse Practitioner

## 2019-02-22 ENCOUNTER — Encounter: Payer: Self-pay | Admitting: Nurse Practitioner

## 2019-02-22 DIAGNOSIS — G4700 Insomnia, unspecified: Secondary | ICD-10-CM

## 2019-02-22 DIAGNOSIS — Z716 Tobacco abuse counseling: Secondary | ICD-10-CM

## 2019-02-22 MED ORDER — VARENICLINE TARTRATE 0.5 MG X 11 & 1 MG X 42 PO MISC: ORAL | 0 refills | Status: DC

## 2019-02-22 MED ORDER — TEMAZEPAM 15 MG PO CAPS: 15.0000 mg | ORAL_CAPSULE | Freq: Every evening | ORAL | 3 refills | Status: DC | PRN

## 2019-02-23 ENCOUNTER — Telehealth: Payer: Self-pay

## 2019-02-23 NOTE — Telephone Encounter (Signed)
-----   Message from Miquel Dunn, NP sent at 02/22/2019  7:45 AM EST ----- Minimal increase in aortic stenosis, will continue to monitor

## 2019-03-02 ENCOUNTER — Other Ambulatory Visit: Payer: Self-pay

## 2019-03-02 ENCOUNTER — Other Ambulatory Visit: Payer: Self-pay | Admitting: Nurse Practitioner

## 2019-03-02 ENCOUNTER — Encounter: Payer: Self-pay | Admitting: Nurse Practitioner

## 2019-03-02 DIAGNOSIS — Z716 Tobacco abuse counseling: Secondary | ICD-10-CM

## 2019-03-02 MED ORDER — VARENICLINE TARTRATE 1 MG PO TABS: 1.0000 mg | ORAL_TABLET | Freq: Two times a day (BID) | ORAL | 2 refills | Status: DC

## 2019-03-02 MED ORDER — VARENICLINE TARTRATE 0.5 MG X 11 & 1 MG X 42 PO MISC: ORAL | 0 refills | Status: DC

## 2019-03-03 ENCOUNTER — Other Ambulatory Visit: Payer: Self-pay | Admitting: Nurse Practitioner

## 2019-03-03 MED ORDER — VARENICLINE TARTRATE 1 MG PO TABS: 1.0000 mg | ORAL_TABLET | Freq: Two times a day (BID) | ORAL | 2 refills | Status: DC

## 2019-03-03 MED ORDER — VARENICLINE TARTRATE 1 MG PO TABS: 1.0000 mg | ORAL_TABLET | Freq: Two times a day (BID) | ORAL | 1 refills | Status: DC

## 2019-03-06 ENCOUNTER — Telehealth: Payer: Self-pay

## 2019-03-07 NOTE — Telephone Encounter (Signed)
error 

## 2019-03-12 ENCOUNTER — Other Ambulatory Visit: Payer: Self-pay | Admitting: Nurse Practitioner

## 2019-04-07 ENCOUNTER — Encounter: Payer: Self-pay | Admitting: Nurse Practitioner

## 2019-04-19 ENCOUNTER — Encounter: Payer: Self-pay | Admitting: Nurse Practitioner

## 2019-04-26 ENCOUNTER — Encounter: Payer: Self-pay | Admitting: Nurse Practitioner

## 2019-04-26 ENCOUNTER — Other Ambulatory Visit: Payer: Self-pay | Admitting: Nurse Practitioner

## 2019-04-26 ENCOUNTER — Other Ambulatory Visit: Payer: Self-pay

## 2019-04-26 ENCOUNTER — Ambulatory Visit (INDEPENDENT_AMBULATORY_CARE_PROVIDER_SITE_OTHER): Admitting: Nurse Practitioner

## 2019-04-26 VITALS — BP 132/74 | HR 76 | Temp 98.1°F | Ht 71.0 in | Wt 252.2 lb

## 2019-04-26 DIAGNOSIS — I10 Essential (primary) hypertension: Secondary | ICD-10-CM | POA: Diagnosis not present

## 2019-04-26 DIAGNOSIS — Z716 Tobacco abuse counseling: Secondary | ICD-10-CM | POA: Diagnosis not present

## 2019-04-26 DIAGNOSIS — E1165 Type 2 diabetes mellitus with hyperglycemia: Secondary | ICD-10-CM

## 2019-04-26 DIAGNOSIS — Z794 Long term (current) use of insulin: Secondary | ICD-10-CM

## 2019-04-26 DIAGNOSIS — I6529 Occlusion and stenosis of unspecified carotid artery: Secondary | ICD-10-CM

## 2019-04-26 DIAGNOSIS — E782 Mixed hyperlipidemia: Secondary | ICD-10-CM | POA: Diagnosis not present

## 2019-04-26 NOTE — Progress Notes (Signed)
Subjective:     Patient ID: Stephen Sampson , male    DOB: June 17, 1961 , 58 y.o.   MRN: [id number]   Chief Complaint  Patient presents with  . Diabetes    Subjective:     Patient ID: Stephen Sampson , male    DOB: [birth year] , 58 y.o.   MRN: [id number]   Chief Complaint  Patient presents with  . Diabetes    HPI  Diabetes  He presents for his follow-up diabetic visit. He has type 2 diabetes mellitus. Pertinent negatives for hypoglycemia include no dizziness or headaches. Pertinent negatives for diabetes include no blurred vision and no chest pain. Symptoms are stable. Current diabetic treatment includes oral agent (dual therapy). His weight is decreasing steadily. He is following a generally healthy (trying to watch portions) diet. When asked about meal planning, he reported none. He has not had a previous visit with a dietitian. He participates in exercise intermittently. His overall blood glucose range is 110-130 mg/dl. An ACE inhibitor/angiotensin II receptor blocker is being taken. Eye exam is current.     Past Medical History:  Diagnosis Date  . Diabetes mellitus without complication ([place])   . Hypertension      History reviewed. No pertinent family history.   Current Outpatient Medications:  .  aspirin EC 81 MG tablet, Take 81 mg by mouth daily., Disp: , Rfl:  .  HUMALOG KWIKPEN 100 UNIT/ML KwikPen, INJECT UNDER THE SKIN BEFORE MEALS AS PER INSULIN SLIDING SCALE PROTOCOL, Disp: 15 pen, Rfl: 1 .  insulin glargine (LANTUS) 100 UNIT/ML injection, Inject 0.48 mLs (48 Units total) into the skin at bedtime., Disp: 10 mL, Rfl: 1 .  Insulin Lispro, Human, (HUMALOG [place]), Inject 15 units of lipase into the skin See admin instructions. Per sliding scale, Disp: , Rfl:  .  temazepam (RESTORIL) 15 MG capsule, Take 1 capsule (15 mg total) by mouth at bedtime as needed for sleep., Disp: 30 capsule, Rfl: 3 .  TRULICITY 1.5 TD/4.2AJ SOPN, INJECT 0.5 ML UNDER THE SKIN EVERY WEEK IN  THE ABDOMEN, THIGH OR UPPER ARM ROTATING INJECTION SITES, Disp: 6 mL, Rfl: 3 .  valsartan (DIOVAN) 160 MG tablet, TAKE 1 TABLET DAILY, Disp: 90 tablet, Rfl: 4 .  varenicline (CHANTIX CONTINUING MONTH PAK) 1 MG tablet, Take 1 tablet (1 mg total) by mouth 2 (two) times daily., Disp: 180 tablet, Rfl: 1 .  atorvastatin (LIPITOR) 40 MG tablet, TAKE 1 TABLET EVERY EVENING, Disp: 90 tablet, Rfl: 3 .  JANUMET 50-1000 MG tablet, TAKE 1 TABLET TWICE A DAY, Disp: 180 tablet, Rfl: 3   Allergies  Allergen Reactions  . Metformin And Related Diarrhea     Review of Systems  Eyes: Negative for blurred vision.  Respiratory: Negative.   Cardiovascular: Negative for chest pain, palpitations and leg swelling.  Musculoskeletal: Negative.   Neurological: Negative.  Negative for dizziness and headaches.     Today's Vitals   04/26/19 0837  BP: 132/74  Pulse: 76  Temp: 98.1 F (36.7 C)  TempSrc: Oral  Weight: 252 lb 3.2 oz (114.4 kg)  Height: '5\' 11"'$  (1.803 m)  PainSc: 0-No pain   Body mass index is 35.17 kg/m.   Objective:  Physical Exam Constitutional:      Appearance: Normal appearance.  Cardiovascular:     Rate and Rhythm: Normal rate and regular rhythm.     Pulses: Normal pulses.     Heart sounds: Normal heart sounds. No murmur.  Pulmonary:  Effort: Pulmonary effort is normal.     Breath sounds: Normal breath sounds.  Neurological:     Mental Status: He is alert.         Assessment And Plan:   1. Controlled type 2 diabetes mellitus with hyperglycemia, with long-term current use of insulin (HCC)  Chronic, improving, last HgbA1c was 6.0 and his glucose was 75  I have encouraged him once again to start titrating down with the Lantus because his blood sugars are decreasing.  His blood sugar this morning was 115.  I have discussed the risk of hypoglycemia as well.    Encouraged to limit intake of sugary foods and drinks  Encouraged to increase physical activity to 150 minutes  per week - Hemoglobin A1c - BMP8+eGFR  2. Essential hypertension . B/P is controlled.  . CMP ordered to check renal function.  . The importance of regular exercise and dietary modification was stressed to the patient.  . Stressed importance of losing ten percent of her body weight to help with B/P control.  . The weight loss would help with decreasing cardiac and cancer risk as well.  - BMP8+eGFR  3. Mixed hyperlipidemia  Chronic, controlled  Continue with current medications  4. Stenosis of carotid artery, unspecified laterality  Chronic, controlled  Continue with current medications  He has a follow up ECHO next month.    5. Encounter for tobacco use cessation counseling  Discussed setting a 12 week quit date  Discussed side effects to include vivid dreams which he has had before when taking Chantix   He is to call once he receives the starter pack for the continuing dose to be sent to the pharmacy - varenicline (CHANTIX STARTING MONTH PAK) 0.5 MG X 11 & 1 MG X 42 tablet; Take one 0.5 mg tablet by mouth once daily for 3 days, then increase to one 0.5 mg tablet twice daily for 4 days, then increase to one 1 mg tablet twice daily.  Dispense: 53 tablet; Refill: 0     [name], FNP   HPI  Diabetes  He presents for his follow-up diabetic visit. He has type 2 diabetes mellitus. His disease course has been improving. Pertinent negatives for hypoglycemia include no dizziness or headaches. Pertinent negatives for diabetes include no blurred vision and no chest pain. Symptoms are stable. Current diabetic treatment includes oral agent (dual therapy). His weight is decreasing steadily. He is following a generally healthy (trying to watch portions) diet. When asked about meal planning, he reported none. He has not had a previous visit with a dietitian. He participates in exercise intermittently. His overall blood glucose range is 110-130 mg/dl. An ACE inhibitor/angiotensin II  receptor blocker is being taken. Eye exam is current.     Past Medical History:  Diagnosis Date  . Diabetes mellitus without complication ([place])   . Hypertension      No family history on file.   Current Outpatient Medications:  .  aspirin EC 81 MG tablet, Take 81 mg by mouth daily., Disp: , Rfl:  .  atorvastatin (LIPITOR) 40 MG tablet, TAKE 1 TABLET EVERY EVENING, Disp: 90 tablet, Rfl: 1 .  HUMALOG KWIKPEN 100 UNIT/ML KwikPen, INJECT UNDER THE SKIN BEFORE MEALS AS PER INSULIN SLIDING SCALE PROTOCOL, Disp: 15 pen, Rfl: 1 .  insulin glargine (LANTUS) 100 UNIT/ML injection, Inject 0.48 mLs (48 Units total) into the skin at bedtime., Disp: 10 mL, Rfl: 1 .  Insulin Lispro, Human, (HUMALOG [place]), Inject 15 units  of lipase into the skin See admin instructions. Per sliding scale, Disp: , Rfl:  .  sitaGLIPtin-metformin (JANUMET) 50-1000 MG per tablet, Take 1 tablet by mouth 2 (two) times daily with a meal., Disp: , Rfl:  .  temazepam (RESTORIL) 15 MG capsule, Take 1 capsule (15 mg total) by mouth at bedtime as needed for sleep., Disp: 30 capsule, Rfl: 3 .  TRULICITY 1.5 KM/6.2MM SOPN, INJECT 0.5 ML UNDER THE SKIN EVERY WEEK IN THE ABDOMEN, THIGH OR UPPER ARM ROTATING INJECTION SITES, Disp: 6 mL, Rfl: 3 .  valsartan (DIOVAN) 160 MG tablet, TAKE 1 TABLET DAILY, Disp: 90 tablet, Rfl: 4 .  varenicline (CHANTIX CONTINUING MONTH PAK) 1 MG tablet, Take 1 tablet (1 mg total) by mouth 2 (two) times daily., Disp: 180 tablet, Rfl: 1   Allergies  Allergen Reactions  . Metformin And Related Diarrhea     Review of Systems  Eyes: Negative for blurred vision.  Respiratory: Negative.   Cardiovascular: Negative for chest pain, palpitations and leg swelling.  Musculoskeletal: Negative.   Neurological: Negative.  Negative for dizziness and headaches.     Today's Vitals   04/26/19 0837  BP: 132/74  Pulse: 76  Temp: 98.1 F (36.7 C)  TempSrc: Oral  Weight: 252 lb 3.2 oz (114.4 kg)  Height: '5\' 11"'$  (1.803  m)  PainSc: 0-No pain   Body mass index is 35.17 kg/m.   Objective:  Physical Exam Constitutional:      Appearance: Normal appearance.  Cardiovascular:     Rate and Rhythm: Normal rate and regular rhythm.     Pulses: Normal pulses.     Heart sounds: Normal heart sounds. No murmur.  Pulmonary:     Effort: Pulmonary effort is normal.     Breath sounds: Normal breath sounds.  Skin:    General: Skin is warm and dry.  Neurological:     General: No focal deficit present.     Mental Status: He is alert and oriented to person, place, and time.  Psychiatric:        Mood and Affect: Mood normal.        Behavior: Behavior normal.        Thought Content: Thought content normal.        Judgment: Judgment normal.         Assessment And Plan:     1. Controlled type 2 diabetes mellitus with hyperglycemia, with long-term current use of insulin (HCC)  Chronic, controlled  With the current pandemic he is at home more  Continue with current medications  Encouraged to limit intake of sugary foods and drinks  Encouraged to increase physical activity to 150 minutes per week - Hemoglobin A1c - CMP14 + Anion Gap  2. Essential hypertension . B/P is controlled.  . CMP ordered to check renal function.  . The importance of regular exercise and dietary modification was stressed to the patient.  . Stressed importance of losing ten percent of her body weight to help with B/P   - CMP14 + Anion Gap  3. Mixed hyperlipidemia  Chronic, controlled  Continue with current medications - Lipid panel  4. Encounter for smoking cessation counseling  Continue with chantix daily doing well.     [name], FNP    THE PATIENT IS ENCOURAGED TO PRACTICE SOCIAL DISTANCING DUE TO THE COVID-19 PANDEMIC.

## 2019-04-27 LAB — LIPID PANEL
Chol/HDL Ratio: 4.3 ratio (ref 0.0–5.0)
Cholesterol, Total: 107 mg/dL (ref 100–199)
HDL: 25 mg/dL — ABNORMAL LOW (ref >39)
LDL Calculated: 53 mg/dL (ref 0–99)
Triglycerides: 144 mg/dL (ref 0–149)
VLDL Cholesterol Cal: 29 mg/dL (ref 5–40)

## 2019-04-27 LAB — CMP14 + ANION GAP
ALT: 20 IU/L (ref 0–44)
AST: 14 IU/L (ref 0–40)
Albumin/Globulin Ratio: 1.5 (ref 1.2–2.2)
Albumin: 4 g/dL (ref 3.8–4.9)
Alkaline Phosphatase: 64 IU/L (ref 39–117)
Anion Gap: 16 mmol/L (ref 10.0–18.0)
BUN/Creatinine Ratio: 13 (ref 9–20)
BUN: 11 mg/dL (ref 6–24)
Bilirubin Total: 0.3 mg/dL (ref 0.0–1.2)
CO2: 17 mmol/L — ABNORMAL LOW (ref 20–29)
Calcium: 8.7 mg/dL (ref 8.7–10.2)
Chloride: 108 mmol/L — ABNORMAL HIGH (ref 96–106)
Creatinine, Ser: 0.82 mg/dL (ref 0.76–1.27)
GFR calc Af Amer: 113 mL/min/{1.73_m2} (ref >59)
GFR calc non Af Amer: 97 mL/min/{1.73_m2} (ref >59)
Globulin, Total: 2.6 g/dL (ref 1.5–4.5)
Glucose: 135 mg/dL — ABNORMAL HIGH (ref 65–99)
Potassium: 4 mmol/L (ref 3.5–5.2)
Sodium: 141 mmol/L (ref 134–144)
Total Protein: 6.6 g/dL (ref 6.0–8.5)

## 2019-04-27 LAB — HEMOGLOBIN A1C
Est. average glucose Bld gHb Est-mCnc: 151 mg/dL
Hgb A1c MFr Bld: 6.9 % — ABNORMAL HIGH (ref 4.8–5.6)

## 2019-05-14 ENCOUNTER — Encounter: Payer: Self-pay | Admitting: Nurse Practitioner

## 2019-05-16 ENCOUNTER — Other Ambulatory Visit: Payer: Self-pay

## 2019-05-16 MED ORDER — GLUCOSE BLOOD VI STRP: ORAL_STRIP | 12 refills | Status: DC

## 2019-05-23 ENCOUNTER — Other Ambulatory Visit: Payer: Self-pay

## 2019-05-23 MED ORDER — GLUCOSE BLOOD VI STRP: ORAL_STRIP | 12 refills | Status: DC

## 2019-05-25 ENCOUNTER — Telehealth: Payer: Self-pay

## 2019-05-25 ENCOUNTER — Other Ambulatory Visit: Payer: Self-pay | Admitting: Nurse Practitioner

## 2019-05-25 MED ORDER — GLUCOSE BLOOD VI STRP: ORAL_STRIP | 4 refills | Status: DC

## 2019-05-25 NOTE — Telephone Encounter (Signed)
I returned pt call and notified him we have sent the refill of test strips to the pharmacy patient was frustrated and stated that we cost him 60 bucks because we didn't send him the correct amount both times he requested I apologized to pt for the inconvenience. YRL,RMA

## 2019-06-06 ENCOUNTER — Other Ambulatory Visit: Payer: Self-pay | Admitting: Nurse Practitioner

## 2019-06-06 DIAGNOSIS — E1165 Type 2 diabetes mellitus with hyperglycemia: Secondary | ICD-10-CM

## 2019-06-06 DIAGNOSIS — Z794 Long term (current) use of insulin: Secondary | ICD-10-CM

## 2019-06-06 MED ORDER — RYBELSUS 7 MG PO TABS: 1.0000 | ORAL_TABLET | Freq: Every day | ORAL | 0 refills | Status: DC

## 2019-06-07 ENCOUNTER — Other Ambulatory Visit: Payer: Self-pay | Admitting: Nurse Practitioner

## 2019-06-27 ENCOUNTER — Encounter: Payer: Self-pay | Admitting: Nurse Practitioner

## 2019-06-28 ENCOUNTER — Other Ambulatory Visit: Payer: Self-pay | Admitting: Nurse Practitioner

## 2019-06-28 DIAGNOSIS — G4700 Insomnia, unspecified: Secondary | ICD-10-CM

## 2019-06-28 MED ORDER — TEMAZEPAM 15 MG PO CAPS: 15.0000 mg | ORAL_CAPSULE | Freq: Every evening | ORAL | 3 refills | Status: DC | PRN

## 2019-07-31 ENCOUNTER — Other Ambulatory Visit: Payer: Self-pay

## 2019-07-31 ENCOUNTER — Encounter: Payer: Self-pay | Admitting: Nurse Practitioner

## 2019-07-31 ENCOUNTER — Ambulatory Visit (INDEPENDENT_AMBULATORY_CARE_PROVIDER_SITE_OTHER): Admitting: Nurse Practitioner

## 2019-07-31 VITALS — BP 134/82 | HR 76 | Temp 97.8°F | Ht 70.6 in | Wt 251.6 lb

## 2019-07-31 DIAGNOSIS — Z139 Encounter for screening, unspecified: Secondary | ICD-10-CM

## 2019-07-31 DIAGNOSIS — Z114 Encounter for screening for human immunodeficiency virus [HIV]: Secondary | ICD-10-CM

## 2019-07-31 DIAGNOSIS — Z0000 Encounter for general adult medical examination without abnormal findings: Secondary | ICD-10-CM

## 2019-07-31 DIAGNOSIS — Z125 Encounter for screening for malignant neoplasm of prostate: Secondary | ICD-10-CM | POA: Diagnosis not present

## 2019-07-31 DIAGNOSIS — E1169 Type 2 diabetes mellitus with other specified complication: Secondary | ICD-10-CM | POA: Diagnosis not present

## 2019-07-31 DIAGNOSIS — Z794 Long term (current) use of insulin: Secondary | ICD-10-CM

## 2019-07-31 DIAGNOSIS — Z716 Tobacco abuse counseling: Secondary | ICD-10-CM

## 2019-07-31 DIAGNOSIS — Z23 Encounter for immunization: Secondary | ICD-10-CM | POA: Diagnosis not present

## 2019-07-31 DIAGNOSIS — I10 Essential (primary) hypertension: Secondary | ICD-10-CM

## 2019-07-31 DIAGNOSIS — D229 Melanocytic nevi, unspecified: Secondary | ICD-10-CM

## 2019-07-31 LAB — POCT URINALYSIS DIPSTICK
Bilirubin, UA: NEGATIVE
Blood, UA: NEGATIVE
Glucose, UA: NEGATIVE
Ketones, UA: NEGATIVE
Leukocytes, UA: NEGATIVE
Nitrite, UA: NEGATIVE
Protein, UA: NEGATIVE
Spec Grav, UA: 1.025 (ref 1.010–1.025)
Urobilinogen, UA: 0.2 E.U./dL
pH, UA: 5.5 (ref 5.0–8.0)

## 2019-07-31 LAB — POCT UA - MICROALBUMIN
Albumin/Creatinine Ratio, Urine, POC: 30
Creatinine, POC: 300 mg/dL
Microalbumin Ur, POC: 30 mg/L

## 2019-07-31 MED ORDER — VARENICLINE TARTRATE 1 MG PO TABS: 1.0000 mg | ORAL_TABLET | Freq: Two times a day (BID) | ORAL | 1 refills | Status: DC

## 2019-07-31 MED ORDER — TRULICITY 1.5 MG/0.5ML ~~LOC~~ SOAJ: 1.5000 mg | SUBCUTANEOUS | 1 refills | Status: DC

## 2019-07-31 NOTE — Progress Notes (Addendum)
Subjective:     Patient ID: Stephen Sampson , male    DOB: 05-21-1961 , 58 y.o.   MRN: [id number]   Chief Complaint  Patient presents with  . Annual Exam   Men's preventive visit. Patient Health Questionnaire (PHQ-2) is    Office Visit from 07/31/2019 in [facility]  PHQ-2 Total Score  0     Patient is on a regular diet, grazing more. Marital status: Married.  Relevant history for alcohol use is:  Social History   Substance and Sexual Activity  Alcohol Use Yes   Comment: occ    Relevant history for tobacco use is:  Social History   Tobacco Use  Smoking Status Current Every Day Smoker  . Packs/day: 1.00  . Types: Cigarettes  Smokeless Tobacco Never Used   Walking about a mile a day.    HPI  Diabetes He presents for his follow-up diabetic visit. He has type 2 diabetes mellitus. His disease course has been improving. Pertinent negatives for hypoglycemia include no dizziness or headaches. Pertinent negatives for diabetes include no blurred vision and no chest pain. There are no hypoglycemic complications. Symptoms are stable. There are no diabetic complications. Risk factors for coronary artery disease include obesity and diabetes mellitus. Current diabetic treatment includes oral agent (dual therapy). His weight is decreasing steadily. He is following a generally healthy (trying to watch portions) diet. When asked about meal planning, he reported none. He has not had a previous visit with a dietitian. He participates in exercise intermittently. There is no change in his home blood glucose trend. (Has had 3 instances was over 200, this am 118.  ) An ACE inhibitor/angiotensin II receptor blocker is being taken. He does not see a podiatrist.Eye exam is current.     Past Medical History:  Diagnosis Date  . Diabetes mellitus without complication ([place])   . Hypertension      No family history on file.   Current Outpatient Medications:  .  aspirin EC  81 MG tablet, Take 81 mg by mouth daily., Disp: , Rfl:  .  atorvastatin (LIPITOR) 40 MG tablet, TAKE 1 TABLET EVERY EVENING, Disp: 90 tablet, Rfl: 3 .  Dulaglutide (TRULICITY) 1.5 YI/5.0YD SOPN, Inject into the skin. Inject 1.5mg  weekly, Disp: , Rfl:  .  glucose blood (FREESTYLE LITE) test strip, Use as instructed, Disp: 300 each, Rfl: 4 .  HUMALOG KWIKPEN 100 UNIT/ML KwikPen, INJECT UNDER THE SKIN BEFORE MEALS AS PER INSULIN SLIDING SCALE PROTOCOL, Disp: 15 pen, Rfl: 1 .  insulin glargine (LANTUS) 100 UNIT/ML injection, Inject 0.48 mLs (48 Units total) into the skin at bedtime., Disp: 10 mL, Rfl: 1 .  Insulin Lispro, Human, (HUMALOG Kremlin), Inject 15 units of lipase into the skin See admin instructions. Per sliding scale, Disp: , Rfl:  .  JANUMET 50-1000 MG tablet, TAKE 1 TABLET TWICE A DAY, Disp: 180 tablet, Rfl: 3 .  LANTUS SOLOSTAR 100 UNIT/ML Solostar Pen, INJECT 50 UNITS UNDER THE SKIN AT BEDTIME, Disp: 135 mL, Rfl: 3 .  temazepam (RESTORIL) 15 MG capsule, Take 1 capsule (15 mg total) by mouth at bedtime as needed for sleep., Disp: 30 capsule, Rfl: 3 .  valsartan (DIOVAN) 160 MG tablet, TAKE 1 TABLET DAILY, Disp: 90 tablet, Rfl: 4 .  varenicline (CHANTIX CONTINUING MONTH PAK) 1 MG tablet, Take 1 tablet (1 mg total) by mouth 2 (two) times daily., Disp: 180 tablet, Rfl: 1 .  Semaglutide (RYBELSUS) 7 MG TABS, Take 1 tablet by  mouth daily. Take 30 minutes before eating (Patient not taking: Reported on 07/31/2019), Disp: 90 tablet, Rfl: 0   Allergies  Allergen Reactions  . Metformin And Related Diarrhea     Review of Systems  Constitutional: Negative.   HENT: Negative.   Eyes: Negative.  Negative for blurred vision.  Respiratory: Negative.   Cardiovascular: Negative.  Negative for chest pain.  Gastrointestinal: Negative.   Endocrine: Negative.   Genitourinary: Negative.   Musculoskeletal: Negative.   Skin:       Multiple nevi to posterior back scattered.   Allergic/Immunologic: Negative.    Neurological: Negative.  Negative for dizziness and headaches.  Hematological: Negative.   Psychiatric/Behavioral: Negative.      Today's Vitals   07/31/19 0855  BP: 134/82  Pulse: 76  Temp: 97.8 F (36.6 C)  TempSrc: Oral  Weight: 251 lb 9.6 oz (114.1 kg)  Height: 5' 10.6" (1.793 m)  PainSc: 0-No pain   Body mass index is 35.49 kg/m.   Objective:  Physical Exam Vitals signs reviewed.  Constitutional:      Appearance: Normal appearance.  HENT:     Head: Normocephalic.     Right Ear: Tympanic membrane, ear canal and external ear normal. There is no impacted cerumen.     Left Ear: Tympanic membrane, ear canal and external ear normal. There is no impacted cerumen.  Eyes:     Extraocular Movements: Extraocular movements intact.     Conjunctiva/sclera: Conjunctivae normal.     Pupils: Pupils are equal, round, and reactive to light.  Neck:     Musculoskeletal: Normal range of motion.  Cardiovascular:     Rate and Rhythm: Normal rate and regular rhythm.     Pulses: Normal pulses.     Heart sounds: Normal heart sounds. No murmur.  Pulmonary:     Effort: Pulmonary effort is normal.     Breath sounds: Normal breath sounds.  Abdominal:     General: Abdomen is flat. Bowel sounds are normal.     Palpations: Abdomen is soft.     Comments: Central obesity  Musculoskeletal: Normal range of motion.  Skin:    General: Skin is warm and dry.     Capillary Refill: Capillary refill takes less than 2 seconds.  Neurological:     General: No focal deficit present.     Mental Status: He is alert and oriented to person, place, and time.  Psychiatric:        Mood and Affect: Mood normal.        Behavior: Behavior normal.        Thought Content: Thought content normal.        Judgment: Judgment normal.         Assessment And Plan:     1. Health maintenance examination . Behavior modifications discussed and diet history reviewed.   . Pt will continue to exercise regularly and  modify diet with low GI, plant based foods and decrease intake of processed foods.  . Recommend intake of daily multivitamin, Vitamin D, and calcium.  . Recommend  for preventive screenings, as well as recommend immunizations that include influenza, TDAP  2. Encounter for prostate cancer screening  - PSA  3. Encounter for screening  - HIV antibody (with reflex)  4. Encounter for immunization  Pneumonia 23 vaccine given  - Pneumococcal polysaccharide vaccine 23-valent greater than or equal to 2yo subcutaneous/IM  5. Essential hypertension . B/P is fairly controlled.  . CMP ordered to check renal function.  [name]  The importance of regular exercise and dietary modification was stressed to the patient.  . Stressed importance of losing ten percent of her body weight to help with B/P control.  . The weight loss would help with decreasing cardiac and cancer risk as well. . EKG done, no changes from previous . Continue follow up with Dr. Einar Gip yearly  - POCT Urinalysis Dipstick (81002) - POCT UA - Microalbumin - EKG 12-Lead - CBC no Diff - CMP14 + Anion Gap  6. Encounter for tobacco use cessation counseling Ready to quit: Yes Counseling given: Yes He continues with chantix as directed - varenicline (CHANTIX CONTINUING MONTH PAK) 1 MG tablet; Take 1 tablet (1 mg total) by mouth 2 (two) times daily.  Dispense: 180 tablet; Refill: 1  7. Multiple nevi  Multiple brown nevi on his back, need to have a screening skin exam - Ambulatory referral to Dermatology  8. Type 2 diabetes mellitus with other specified complication, with long-term current use of insulin (HCC)  Chronic, controlled  Continue with current medications, Trulicity and insulin as he was not interested in Rybelsus and feels caused him side effects  Encouraged to limit intake of sugary foods and drinks - Lipid Profile - Hemoglobin A1c - Dulaglutide (TRULICITY) 1.5 ZC/5.8IF SOPN; Inject 1.5 mg into the skin once a week.  Inject 1.5mg  weekly  Dispense: 12 pen; Refill: 1 - Ambulatory referral to [place], FNP    THE PATIENT IS ENCOURAGED TO PRACTICE SOCIAL DISTANCING DUE TO THE COVID-19 PANDEMIC.

## 2019-08-01 LAB — CBC
Hematocrit: 45 % (ref 37.5–51.0)
Hemoglobin: 15.1 g/dL (ref 13.0–17.7)
MCH: 29.4 pg (ref 26.6–33.0)
MCHC: 33.6 g/dL (ref 31.5–35.7)
MCV: 88 fL (ref 79–97)
Platelets: 198 10*3/uL (ref 150–450)
RBC: 5.13 x10E6/uL (ref 4.14–5.80)
RDW: 13 % (ref 11.6–15.4)
WBC: 9.5 10*3/uL (ref 3.4–10.8)

## 2019-08-01 LAB — CMP14 + ANION GAP
ALT: 21 IU/L (ref 0–44)
AST: 15 IU/L (ref 0–40)
Albumin/Globulin Ratio: 1.7 (ref 1.2–2.2)
Albumin: 4.3 g/dL (ref 3.8–4.9)
Alkaline Phosphatase: 78 IU/L (ref 39–117)
Anion Gap: 15 mmol/L (ref 10.0–18.0)
BUN/Creatinine Ratio: 12 (ref 9–20)
BUN: 12 mg/dL (ref 6–24)
Bilirubin Total: 0.4 mg/dL (ref 0.0–1.2)
CO2: 19 mmol/L — ABNORMAL LOW (ref 20–29)
Calcium: 9.1 mg/dL (ref 8.7–10.2)
Chloride: 105 mmol/L (ref 96–106)
Creatinine, Ser: 1 mg/dL (ref 0.76–1.27)
GFR calc Af Amer: 95 mL/min/{1.73_m2} (ref >59)
GFR calc non Af Amer: 83 mL/min/{1.73_m2} (ref >59)
Globulin, Total: 2.6 g/dL (ref 1.5–4.5)
Glucose: 139 mg/dL — ABNORMAL HIGH (ref 65–99)
Potassium: 4.1 mmol/L (ref 3.5–5.2)
Sodium: 139 mmol/L (ref 134–144)
Total Protein: 6.9 g/dL (ref 6.0–8.5)

## 2019-08-01 LAB — LIPID PANEL
Chol/HDL Ratio: 4.2 ratio (ref 0.0–5.0)
Cholesterol, Total: 105 mg/dL (ref 100–199)
HDL: 25 mg/dL — ABNORMAL LOW (ref >39)
LDL Calculated: 58 mg/dL (ref 0–99)
Triglycerides: 111 mg/dL (ref 0–149)
VLDL Cholesterol Cal: 22 mg/dL (ref 5–40)

## 2019-08-01 LAB — HEMOGLOBIN A1C
Est. average glucose Bld gHb Est-mCnc: 148 mg/dL
Hgb A1c MFr Bld: 6.8 % — ABNORMAL HIGH (ref 4.8–5.6)

## 2019-08-01 LAB — PSA: Prostate Specific Ag, Serum: 0.3 ng/mL (ref 0.0–4.0)

## 2019-08-01 LAB — HIV ANTIBODY (ROUTINE TESTING W REFLEX): HIV Screen 4th Generation wRfx: NONREACTIVE

## 2019-08-13 ENCOUNTER — Encounter: Payer: Self-pay | Admitting: Nurse Practitioner

## 2019-08-13 NOTE — Patient Instructions (Signed)
Health Maintenance  Topic Date Due  . INFLUENZA VACCINE  07/22/2019  . OPHTHALMOLOGY EXAM  12/02/2019  . HEMOGLOBIN A1C  01/31/2020  . FOOT EXAM  07/30/2020  . TETANUS/TDAP  07/13/2026  . COLONOSCOPY  08/20/2027  . PNEUMOCOCCAL POLYSACCHARIDE VACCINE AGE 59-64 HIGH RISK  Completed  . Hepatitis C Screening  Completed  . HIV Screening  Completed   Health Maintenance, Male Adopting a healthy lifestyle and getting preventive care are important in promoting health and wellness. Ask your health care provider about:  The right schedule for you to have regular tests and exams.  Things you can do on your own to prevent diseases and keep yourself healthy. What should I know about diet, weight, and exercise? Eat a healthy diet   Eat a diet that includes plenty of vegetables, fruits, low-fat dairy products, and lean protein.  Do not eat a lot of foods that are high in solid fats, added sugars, or sodium. Maintain a healthy weight Body mass index (BMI) is a measurement that can be used to identify possible weight problems. It estimates body fat based on height and weight. Your health care provider can help determine your BMI and help you achieve or maintain a healthy weight. Get regular exercise Get regular exercise. This is one of the most important things you can do for your health. Most adults should:  Exercise for at least 150 minutes each week. The exercise should increase your heart rate and make you sweat (moderate-intensity exercise).  Do strengthening exercises at least twice a week. This is in addition to the moderate-intensity exercise.  Spend less time sitting. Even light physical activity can be beneficial. Watch cholesterol and blood lipids Have your blood tested for lipids and cholesterol at 59 years of age, then have this test every 5 years. You may need to have your cholesterol levels checked more often if:  Your lipid or cholesterol levels are high.  You are older than 59  years of age.  You are at high risk for heart disease. What should I know about cancer screening? Many types of cancers can be detected early and may often be prevented. Depending on your health history and family history, you may need to have cancer screening at various ages. This may include screening for:  Colorectal cancer.  Prostate cancer.  Skin cancer.  Lung cancer. What should I know about heart disease, diabetes, and high blood pressure? Blood pressure and heart disease  High blood pressure causes heart disease and increases the risk of stroke. This is more likely to develop in people who have high blood pressure readings, are of African descent, or are overweight.  Talk with your health care provider about your target blood pressure readings.  Have your blood pressure checked: ? Every 3-5 years if you are 59-93 years of age. ? Every year if you are 59 years old or older.  If you are between the ages of 80 and 68 and are a current or former smoker, ask your health care provider if you should have a one-time screening for abdominal aortic aneurysm (AAA). Diabetes Have regular diabetes screenings. This checks your fasting blood sugar level. Have the screening done:  Once every three years after age 75 if you are at a normal weight and have a low risk for diabetes.  More often and at a younger age if you are overweight or have a high risk for diabetes. What should I know about preventing infection? Hepatitis B If you  have a higher risk for hepatitis B, you should be screened for this virus. Talk with your health care provider to find out if you are at risk for hepatitis B infection. Hepatitis C Blood testing is recommended for:  Everyone born from [birth year] through 1966.  Anyone with known risk factors for hepatitis C. Sexually transmitted infections (STIs)  You should be screened each year for STIs, including gonorrhea and chlamydia, if: ? You are sexually active and are  younger than 59 years of age. ? You are older than 59 years of age and your health care provider tells you that you are at risk for this type of infection. ? Your sexual activity has changed since you were last screened, and you are at increased risk for chlamydia or gonorrhea. Ask your health care provider if you are at risk.  Ask your health care provider about whether you are at high risk for HIV. Your health care provider may recommend a prescription medicine to help prevent HIV infection. If you choose to take medicine to prevent HIV, you should first get tested for HIV. You should then be tested every 3 months for as long as you are taking the medicine. Follow these instructions at home: Lifestyle  Do not use any products that contain nicotine or tobacco, such as cigarettes, e-cigarettes, and chewing tobacco. If you need help quitting, ask your health care provider.  Do not use street drugs.  Do not share needles.  Ask your health care provider for help if you need support or information about quitting drugs. Alcohol use  Do not drink alcohol if your health care provider tells you not to drink.  If you drink alcohol: ? Limit how much you have to 0-2 drinks a day. ? Be aware of how much alcohol is in your drink. In the [place] one drink equals one 12 oz bottle of beer (355 mL), one 5 oz glass of wine (148 mL), or one 1 oz glass of hard liquor (44 mL). General instructions  Schedule regular health, dental, and eye exams.  Stay current with your vaccines.  Tell your health care provider if: ? You often feel depressed. ? You have ever been abused or do not feel safe at home. Summary  Adopting a healthy lifestyle and getting preventive care are important in promoting health and wellness.  Follow your health care provider's instructions about healthy diet, exercising, and getting tested or screened for diseases.  Follow your health care provider's instructions on monitoring your  cholesterol and blood pressure. This information is not intended to replace advice given to you by your health care provider. Make sure you discuss any questions you have with your health care provider. Document Released: 06/04/2008 Document Revised: 11/30/2018 Document Reviewed: 11/30/2018 Elsevier Patient Education  2020 [employer].

## 2019-08-23 ENCOUNTER — Encounter: Payer: Self-pay | Admitting: Podiatry

## 2019-08-23 ENCOUNTER — Other Ambulatory Visit: Payer: Self-pay

## 2019-08-23 ENCOUNTER — Ambulatory Visit (INDEPENDENT_AMBULATORY_CARE_PROVIDER_SITE_OTHER): Admitting: Podiatry

## 2019-08-23 VITALS — BP 157/89 | HR 71

## 2019-08-23 DIAGNOSIS — E1165 Type 2 diabetes mellitus with hyperglycemia: Secondary | ICD-10-CM | POA: Diagnosis not present

## 2019-08-23 DIAGNOSIS — Z794 Long term (current) use of insulin: Secondary | ICD-10-CM

## 2019-08-23 DIAGNOSIS — L84 Corns and callosities: Secondary | ICD-10-CM | POA: Diagnosis not present

## 2019-08-23 NOTE — Patient Instructions (Addendum)
Diabetes Mellitus and Foot Care Foot care is an important part of your health, especially when you have diabetes. Diabetes may cause you to have problems because of poor blood flow (circulation) to your feet and legs, which can cause your skin to:  Become thinner and drier.  Break more easily.  Heal more slowly.  Peel and crack. You may also have nerve damage (neuropathy) in your legs and feet, causing decreased feeling in them. This means that you may not notice minor injuries to your feet that could lead to more serious problems. Noticing and addressing any potential problems early is the best way to prevent future foot problems. How to care for your feet Foot hygiene  Wash your feet daily with warm water and mild soap. Do not use hot water. Then, pat your feet and the areas between your toes until they are completely dry. Do not soak your feet as this can dry your skin.  Trim your toenails straight across. Do not dig under them or around the cuticle. File the edges of your nails with an emery board or nail file.  Apply a moisturizing lotion or petroleum jelly to the skin on your feet and to dry, brittle toenails. Use lotion that does not contain alcohol and is unscented. Do not apply lotion between your toes. Shoes and socks  Wear clean socks or stockings every day. Make sure they are not too tight. Do not wear knee-high stockings since they may decrease blood flow to your legs.  Wear shoes that fit properly and have enough cushioning. Always look in your shoes before you put them on to be sure there are no objects inside.  To break in new shoes, wear them for just a few hours a day. This prevents injuries on your feet. Wounds, scrapes, corns, and calluses  Check your feet daily for blisters, cuts, bruises, sores, and redness. If you cannot see the bottom of your feet, use a mirror or ask someone for help.  Do not cut corns or calluses or try to remove them with medicine.  If you  find a minor scrape, cut, or break in the skin on your feet, keep it and the skin around it clean and dry. You may clean these areas with mild soap and water. Do not clean the area with peroxide, alcohol, or iodine.  If you have a wound, scrape, corn, or callus on your foot, look at it several times a day to make sure it is healing and not infected. Check for: ? Redness, swelling, or pain. ? Fluid or blood. ? Warmth. ? Pus or a bad smell. General instructions  Do not cross your legs. This may decrease blood flow to your feet.  Do not use heating pads or hot water bottles on your feet. They may burn your skin. If you have lost feeling in your feet or legs, you may not know this is happening until it is too late.  Protect your feet from hot and cold by wearing shoes, such as at the beach or on hot pavement.  Schedule a complete foot exam at least once a year (annually) or more often if you have foot problems. If you have foot problems, report any cuts, sores, or bruises to your health care provider immediately. Contact a health care provider if:  You have a medical condition that increases your risk of infection and you have any cuts, sores, or bruises on your feet.  You have an injury that is not   healing.  You have redness on your legs or feet.  You feel burning or tingling in your legs or feet.  You have pain or cramps in your legs and feet.  Your legs or feet are numb.  Your feet always feel cold.  You have pain around a toenail. Get help right away if:  You have a wound, scrape, corn, or callus on your foot and: ? You have pain, swelling, or redness that gets worse. ? You have fluid or blood coming from the wound, scrape, corn, or callus. ? Your wound, scrape, corn, or callus feels warm to the touch. ? You have pus or a bad smell coming from the wound, scrape, corn, or callus. ? You have a fever. ? You have a red line going up your leg. Summary  Check your feet every day  for cuts, sores, red spots, swelling, and blisters.  Moisturize feet and legs daily.  Wear shoes that fit properly and have enough cushioning.  If you have foot problems, report any cuts, sores, or bruises to your health care provider immediately.  Schedule a complete foot exam at least once a year (annually) or more often if you have foot problems. This information is not intended to replace advice given to you by your health care provider. Make sure you discuss any questions you have with your health care provider. Document Released: 12/04/2000 Document Revised: 01/19/2018 Document Reviewed: 01/08/2017 Elsevier Patient Education  2020 [employer]  Corns and Calluses Corns are small areas of thickened skin that occur on the top, sides, or tip of a toe. They contain a cone-shaped core with a point that can press on a nerve below. This causes pain.  Calluses are areas of thickened skin that can occur anywhere on the body, including the hands, fingers, palms, soles of the feet, and heels. Calluses are usually larger than corns. What are the causes? Corns and calluses are caused by rubbing (friction) or pressure, such as from shoes that are too tight or do not fit properly. What increases the risk? Corns are more likely to develop in people who have misshapen toes (toe deformities), such as hammer toes. Calluses can occur with friction to any area of the skin. They are more likely to develop in people who:  Work with their hands.  Wear shoes that fit poorly, are too tight, or are high-heeled.  Have toe deformities. What are the signs or symptoms? Symptoms of a corn or callus include:  A hard growth on the skin.  Pain or tenderness under the skin.  Redness and swelling.  Increased discomfort while wearing tight-fitting shoes, if your feet are affected. If a corn or callus becomes infected, symptoms may include:  Redness and swelling that gets worse.  Pain.  Fluid, blood, or  pus draining from the corn or callus. How is this diagnosed? Corns and calluses may be diagnosed based on your symptoms, your medical history, and a physical exam. How is this treated? Treatment for corns and calluses may include:  Removing the cause of the friction or pressure. This may involve: ? Changing your shoes. ? Wearing shoe inserts (orthotics) or other protective layers in your shoes, such as a corn pad. ? Wearing gloves.  Applying medicine to the skin (topical medicine) to help soften skin in the hardened, thickened areas.  Removing layers of dead skin with a file to reduce the size of the corn or callus.  Removing the corn or callus with a scalpel or   laser.  Taking antibiotic medicines, if your corn or callus is infected.  Having surgery, if a toe deformity is the cause. Follow these instructions at home:   Take over-the-counter and prescription medicines only as told by your health care provider.  If you were prescribed an antibiotic, take it as told by your health care provider. Do not stop taking it even if your condition starts to improve.  Wear shoes that fit well. Avoid wearing high-heeled shoes and shoes that are too tight or too loose.  Wear any padding, protective layers, gloves, or orthotics as told by your health care provider.  Soak your hands or feet and then use a file or pumice stone to soften your corn or callus. Do this as told by your health care provider.  Check your corn or callus every day for symptoms of infection. Contact a health care provider if you:  Notice that your symptoms do not improve with treatment.  Have redness or swelling that gets worse.  Notice that your corn or callus becomes painful.  Have fluid, blood, or pus coming from your corn or callus.  Have new symptoms. Summary  Corns are small areas of thickened skin that occur on the top, sides, or tip of a toe.  Calluses are areas of thickened skin that can occur anywhere  on the body, including the hands, fingers, palms, and soles of the feet. Calluses are usually larger than corns.  Corns and calluses are caused by rubbing (friction) or pressure, such as from shoes that are too tight or do not fit properly.  Treatment may include wearing any padding, protective layers, gloves, or orthotics as told by your health care provider. This information is not intended to replace advice given to you by your health care provider. Make sure you discuss any questions you have with your health care provider. Document Released: 09/12/2004 Document Revised: 03/29/2019 Document Reviewed: 10/20/2017 Elsevier Patient Education  2020 [employer]  

## 2019-08-29 NOTE — Progress Notes (Signed)
Subjective: Stephen Sampson is a pleasant 58 yo Caucasian male who presents today referred by [name], FNP for diabetic foot evaluation.  Patient relates 10 year history of diabetes.  Patient denies any history of foot wounds.  Patient admits numbness and tingling in feet on occasion. Denies any history of burning or pins/needles sensations.  He does take Chantix and still smokes. He has smoked 1ppd for the past 40 years.   Today, patient c/o of painful, discolored, thick toenails which interfere with daily activities.  Pain is aggravated when wearing enclosed shoe gear.   Past Medical History:  Diagnosis Date  . Diabetes mellitus without complication ([place])   . Hypertension     Patient Active Problem List   Diagnosis Date Noted  . Controlled type 2 diabetes mellitus with hyperglycemia ([place]) 10/26/2018  . Mixed hyperlipidemia 10/26/2018  . Vitamin D deficiency 10/26/2018  . Essential hypertension 10/26/2018  . Stenosis of carotid artery 10/26/2018    Past Surgical History:  Procedure Laterality Date  . CARDIAC CATHETERIZATION       Current Outpatient Medications:  .  aspirin EC 81 MG tablet, Take 81 mg by mouth daily., Disp: , Rfl:  .  atorvastatin (LIPITOR) 40 MG tablet, TAKE 1 TABLET EVERY EVENING, Disp: 90 tablet, Rfl: 3 .  Dulaglutide (TRULICITY) 1.5 0000000 SOPN, Inject 1.5 mg into the skin once a week. Inject 1.5mg  weekly, Disp: 12 pen, Rfl: 1 .  glucose blood (FREESTYLE LITE) test strip, Use as instructed, Disp: 300 each, Rfl: 4 .  HUMALOG KWIKPEN 100 UNIT/ML KwikPen, INJECT UNDER THE SKIN BEFORE MEALS AS PER INSULIN SLIDING SCALE PROTOCOL, Disp: 15 pen, Rfl: 1 .  insulin glargine (LANTUS) 100 UNIT/ML injection, Inject 0.48 mLs (48 Units total) into the skin at bedtime., Disp: 10 mL, Rfl: 1 .  Insulin Lispro, Human, (HUMALOG [place]), Inject 15 units of lipase into the skin See admin instructions. Per sliding scale, Disp: , Rfl:  .  JANUMET 50-1000 MG tablet,  TAKE 1 TABLET TWICE A DAY, Disp: 180 tablet, Rfl: 3 .  LANTUS SOLOSTAR 100 UNIT/ML Solostar Pen, INJECT 50 UNITS UNDER THE SKIN AT BEDTIME, Disp: 135 mL, Rfl: 3 .  temazepam (RESTORIL) 15 MG capsule, Take 1 capsule (15 mg total) by mouth at bedtime as needed for sleep., Disp: 30 capsule, Rfl: 3 .  valsartan (DIOVAN) 160 MG tablet, TAKE 1 TABLET DAILY, Disp: 90 tablet, Rfl: 4 .  varenicline (CHANTIX CONTINUING MONTH PAK) 1 MG tablet, Take 1 tablet (1 mg total) by mouth 2 (two) times daily., Disp: 180 tablet, Rfl: 1  Allergies  Allergen Reactions  . Metformin And Related Diarrhea    Social History   Occupational History  . Not on file  Tobacco Use  . Smoking status: Current Every Day Smoker    Packs/day: 1.00    Types: Cigarettes  . Smokeless tobacco: Never Used  Substance and Sexual Activity  . Alcohol use: Yes    Comment: occ   . Drug use: No  . Sexual activity: Not on file    History reviewed. No pertinent family history.  Immunization History  Administered Date(s) Administered  . Influenza,inj,Quad PF,6+ Mos 10/27/2018  . Influenza-Unspecified 10/15/2015, 10/19/2016, 10/20/2017  . Pneumococcal Polysaccharide-23 07/31/2019  . Tdap 07/13/2016    Review of systems: Positive Findings in bold print.  Constitutional:  chills, fatigue, fever, sweats, weight change Communication: Optometrist, sign Ecologist, hand writing, iPad/Android device Head: headaches, head injury Eyes: changes in vision, eye pain, glaucoma, cataracts,  macular degeneration, diplopia, glare,  light sensitivity, eyeglasses or contacts, blindness Ears nose mouth throat: hearing impaired, hearing aids,  ringing in ears, deaf, sign language,  vertigo, nosebleeds,  rhinitis,  cold sores, snoring, swollen glands Cardiovascular: HTN, edema, arrhythmia, pacemaker in place, defibrillator in place, chest pain/tightness, chronic anticoagulation, blood clot, heart failure, MI Peripheral Vascular: leg cramps,  varicose veins, blood clots, lymphedema, varicosities Respiratory:  difficulty breathing, denies congestion, SOB, wheezing, cough, emphysema Gastrointestinal: change in appetite or weight, abdominal pain, constipation, diarrhea, nausea, vomiting, vomiting blood, change in bowel habits, abdominal pain, jaundice, rectal bleeding, hemorrhoids, GERD Genitourinary:  nocturia,  pain on urination, polyuria,  blood in urine, Foley catheter, urinary urgency, ESRD on hemodialysis Musculoskeletal: amputation, cramping, stiff joints, painful joints, decreased joint motion, fractures, OA, gout, hemiplegia, paraplegia, uses cane, wheelchair bound, uses walker, uses rollator Skin: +changes in toenails, color change, dryness, itching, mole changes,  rash, wound(s) Neurological: headaches, numbness in feet, paresthesias in feet, burning in feet, fainting,  seizures, change in speech,  headaches, memory problems/poor historian, cerebral palsy, weakness, paralysis, CVA, TIA Endocrine: diabetes, hypothyroidism, hyperthyroidism,  goiter, dry mouth, flushing, heat intolerance,  cold intolerance,  excessive thirst, denies polyuria,  nocturia Hematological:  easy bleeding, excessive bleeding, easy bruising, enlarged lymph nodes, on long term blood thinner, history of past transusions Allergy/immunological:  hives, eczema, frequent infections, multiple drug allergies, seasonal allergies, transplant recipient, multiple food allergies Psychiatric:  anxiety, depression, mood disorder, suicidal ideations, hallucinations, insomnia  Objective: Vitals:   08/23/19 0841  BP: (!) 157/89  Pulse: 71   Vascular Examination: Capillary refill time immediate x 10 digits  Dorsalis pedis and Posterior tibial pulses 2/4 b/l.  Digital hair present x 10 digits.  Skin temperature gradient WNL b/l.  Dermatological Examination: Skin with normal turgor, texture and tone b/l.  Toenails 1-5 b/l adequate length and well  maintained.  Hyperkeratotic lesion submet head 5 b/l. No edema, no erythema, no drainage, no flocculence.  Musculoskeletal: Muscle strength 5/5 to all LE muscle groups.  No gross pedal deformities b/l.  No pain, crepitus or joint discomfort with passive/active ROM b/l.  Neurological: Sensation intact 5/5 b/l with 10 gram monofilament.  Vibratory sensation intact b/l.  Assessment: 1. Calluses submet head 5 b/l 2. NIDDM  Plan: 1. Discussed diabetic foot care principles. Literature dispensed on today. Hyperkeratotic lesions pared submet head 5 b/l utilizing sterile scalpel blade without incident. 2. Patient to continue soft, supportive shoe gear daily. 3. Patient to report any pedal injuries to medical professional immediately. 4. Follow up 3 months.  5. Patient/POA to call should there be a concern in the interim.

## 2019-08-31 ENCOUNTER — Other Ambulatory Visit: Payer: Self-pay | Admitting: Nurse Practitioner

## 2019-11-01 ENCOUNTER — Ambulatory Visit (INDEPENDENT_AMBULATORY_CARE_PROVIDER_SITE_OTHER): Admitting: Nurse Practitioner

## 2019-11-01 ENCOUNTER — Other Ambulatory Visit: Payer: Self-pay

## 2019-11-01 ENCOUNTER — Encounter: Payer: Self-pay | Admitting: Nurse Practitioner

## 2019-11-01 VITALS — BP 120/78 | HR 71 | Temp 98.4°F | Ht 70.6 in | Wt 254.4 lb

## 2019-11-01 DIAGNOSIS — Z794 Long term (current) use of insulin: Secondary | ICD-10-CM

## 2019-11-01 DIAGNOSIS — G4700 Insomnia, unspecified: Secondary | ICD-10-CM

## 2019-11-01 DIAGNOSIS — E782 Mixed hyperlipidemia: Secondary | ICD-10-CM | POA: Diagnosis not present

## 2019-11-01 DIAGNOSIS — E1169 Type 2 diabetes mellitus with other specified complication: Secondary | ICD-10-CM

## 2019-11-01 DIAGNOSIS — I10 Essential (primary) hypertension: Secondary | ICD-10-CM | POA: Diagnosis not present

## 2019-11-01 DIAGNOSIS — Z716 Tobacco abuse counseling: Secondary | ICD-10-CM

## 2019-11-01 LAB — BMP8+EGFR
BUN/Creatinine Ratio: 11 (ref 9–20)
BUN: 11 mg/dL (ref 6–24)
CO2: 19 mmol/L — ABNORMAL LOW (ref 20–29)
Calcium: 9.1 mg/dL (ref 8.7–10.2)
Chloride: 106 mmol/L (ref 96–106)
Creatinine, Ser: 1.01 mg/dL (ref 0.76–1.27)
GFR calc Af Amer: 94 mL/min/{1.73_m2} (ref >59)
GFR calc non Af Amer: 81 mL/min/{1.73_m2} (ref >59)
Glucose: 137 mg/dL — ABNORMAL HIGH (ref 65–99)
Potassium: 4.3 mmol/L (ref 3.5–5.2)
Sodium: 139 mmol/L (ref 134–144)

## 2019-11-01 LAB — HEMOGLOBIN A1C
Est. average glucose Bld gHb Est-mCnc: 171 mg/dL
Hgb A1c MFr Bld: 7.6 % — ABNORMAL HIGH (ref 4.8–5.6)

## 2019-11-01 MED ORDER — TEMAZEPAM 15 MG PO CAPS: 15.0000 mg | ORAL_CAPSULE | Freq: Every evening | ORAL | 5 refills | Status: DC | PRN

## 2019-11-01 NOTE — Progress Notes (Signed)
Subjective:     Patient ID: Stephen Sampson , male    DOB: 17-Jun-1961 , 58 y.o.   MRN: [id number]   Chief Complaint  Patient presents with  . Diabetes    Subjective:     Patient ID: Stephen Sampson , male    DOB: 10/13/1961 , 58 y.o.   MRN: [id number]   Chief Complaint  Patient presents with  . Diabetes    HPI  Diabetes  He presents for his follow-up diabetic visit. He has type 2 diabetes mellitus. Pertinent negatives for hypoglycemia include no dizziness or headaches. Pertinent negatives for diabetes include no blurred vision and no chest pain. Symptoms are stable. Current diabetic treatment includes oral agent (dual therapy). His weight is decreasing steadily. He is following a generally healthy (trying to watch portions) diet. When asked about meal planning, he reported none. He has not had a previous visit with a dietitian. He participates in exercise intermittently. His overall blood glucose range is 110-130 mg/dl. An ACE inhibitor/angiotensin II receptor blocker is being taken. Eye exam is current.     Past Medical History:  Diagnosis Date  . Diabetes mellitus without complication ([place])   . Hypertension      No family history on file.   Current Outpatient Medications:  .  aspirin EC 81 MG tablet, Take 81 mg by mouth daily., Disp: , Rfl:  .  atorvastatin (LIPITOR) 40 MG tablet, TAKE 1 TABLET EVERY EVENING, Disp: 90 tablet, Rfl: 3 .  B-D UF III MINI PEN NEEDLES 31G X 5 MM MISC, USE AS DIRECTED, Disp: 100 each, Rfl: 3 .  Dulaglutide (TRULICITY) 1.5 YH/0.6CB SOPN, Inject 1.5 mg into the skin once a week. Inject 1.69m weekly, Disp: 12 pen, Rfl: 1 .  glucose blood (FREESTYLE LITE) test strip, Use as instructed, Disp: 300 each, Rfl: 4 .  HUMALOG KWIKPEN 100 UNIT/ML KwikPen, INJECT UNDER THE SKIN BEFORE MEALS AS PER INSULIN SLIDING SCALE PROTOCOL, Disp: 45 mL, Rfl: 3 .  insulin glargine (LANTUS) 100 UNIT/ML injection, Inject 0.48 mLs (48 Units total) into the skin at  bedtime., Disp: 10 mL, Rfl: 1 .  Insulin Lispro, Human, (HUMALOG [place]), Inject 15 units of lipase into the skin See admin instructions. Per sliding scale, Disp: , Rfl:  .  JANUMET 50-1000 MG tablet, TAKE 1 TABLET TWICE A DAY, Disp: 180 tablet, Rfl: 3 .  temazepam (RESTORIL) 15 MG capsule, Take 1 capsule (15 mg total) by mouth at bedtime as needed for sleep., Disp: 30 capsule, Rfl: 3 .  valsartan (DIOVAN) 160 MG tablet, TAKE 1 TABLET DAILY, Disp: 90 tablet, Rfl: 4 .  varenicline (CHANTIX CONTINUING MONTH PAK) 1 MG tablet, Take 1 tablet (1 mg total) by mouth 2 (two) times daily., Disp: 180 tablet, Rfl: 1 .  LANTUS SOLOSTAR 100 UNIT/ML Solostar Pen, INJECT 50 UNITS UNDER THE SKIN AT BEDTIME, Disp: 135 mL, Rfl: 3   Allergies  Allergen Reactions  . Metformin And Related Diarrhea     Review of Systems  Eyes: Negative for blurred vision.  Respiratory: Negative.   Cardiovascular: Negative for chest pain, palpitations and leg swelling.  Musculoskeletal: Negative.   Neurological: Negative.  Negative for dizziness and headaches.     Today's Vitals   11/01/19 0849  BP: 120/78  Pulse: 71  Temp: 98.4 F (36.9 C)  TempSrc: Oral  Weight: 254 lb 6.4 oz (115.4 kg)  Height: 5' 10.6" (1.793 m)  PainSc: 0-No pain   Body mass index is 35.88 kg/m.  Objective:  Physical Exam Constitutional:      Appearance: Normal appearance.  Cardiovascular:     Rate and Rhythm: Normal rate and regular rhythm.     Pulses: Normal pulses.     Heart sounds: Normal heart sounds. No murmur.  Pulmonary:     Effort: Pulmonary effort is normal.     Breath sounds: Normal breath sounds.  Neurological:     Mental Status: He is alert.          Assessment And Plan:   1. Controlled type 2 diabetes mellitus with hyperglycemia, with long-term current use of insulin (HCC)  Chronic, improving, last HgbA1c 6.8  Encouraged to increase his distance with his walking and/or change intensity  Also focus on avoiding  grazing on unhealthy foods.   Discussed how stress can affect his blood sugar - Hemoglobin A1c - BMP8+eGFR  2. Essential hypertension . B/P is well controlled.  [name] BMP ordered to check renal function.  . The importance of regular exercise and dietary modification was stressed to the patient.  - BMP8+eGFR  3. Mixed hyperlipidemia  Chronic, controlled  Continue with current medications    4. Encounter for tobacco use cessation counseling  He is under more stress with his current living situation with his inlaws living with him  Continue with chantix  He is to call once he receives the starter pack for the continuing dose to be sent to the pharmacy  5. Insomnia, unspecified type  Chronic, takes daily at bedtime  Refill sent to pharmacy - temazepam (RESTORIL) 15 MG capsule; Take 1 capsule (15 mg total) by mouth at bedtime as needed for sleep.  Dispense: 30 capsule; Refill: [place], FNP   HPI  He is walking a mile 5 days a week for at least.    Lantus 48 units at night and using 20-25 units before every meal.  Still working from home.  He does continue to graze while working from home.    Diabetes He presents for his follow-up diabetic visit. He has type 2 diabetes mellitus. His disease course has been improving. Pertinent negatives for hypoglycemia include no dizziness or headaches. Pertinent negatives for diabetes include no blurred vision and no chest pain. Symptoms are stable. Risk factors for coronary artery disease include obesity and sedentary lifestyle. Current diabetic treatment includes oral agent (dual therapy). He is compliant with treatment all of the time. His weight is decreasing steadily. He is following a generally healthy (trying to watch portions) diet. When asked about meal planning, he reported none. He has not had a previous visit with a dietitian. He participates in exercise intermittently. His overall blood glucose range is 110-130 mg/dl.  (Blood sugar 130-180's, no changes in eating habits.  ) An ACE inhibitor/angiotensin II receptor blocker is being taken. He does not see a podiatrist.Eye exam is current.     Past Medical History:  Diagnosis Date  . Diabetes mellitus without complication ([place])   . Hypertension      No family history on file.   Current Outpatient Medications:  .  aspirin EC 81 MG tablet, Take 81 mg by mouth daily., Disp: , Rfl:  .  atorvastatin (LIPITOR) 40 MG tablet, TAKE 1 TABLET EVERY EVENING, Disp: 90 tablet, Rfl: 3 .  B-D UF III MINI PEN NEEDLES 31G X 5 MM MISC, USE AS DIRECTED, Disp: 100 each, Rfl: 3 .  Dulaglutide (TRULICITY) 1.5 MA/2.6JF SOPN, Inject 1.5 mg into the skin once  a week. Inject 1.37m weekly, Disp: 12 pen, Rfl: 1 .  glucose blood (FREESTYLE LITE) test strip, Use as instructed, Disp: 300 each, Rfl: 4 .  HUMALOG KWIKPEN 100 UNIT/ML KwikPen, INJECT UNDER THE SKIN BEFORE MEALS AS PER INSULIN SLIDING SCALE PROTOCOL, Disp: 45 mL, Rfl: 3 .  insulin glargine (LANTUS) 100 UNIT/ML injection, Inject 0.48 mLs (48 Units total) into the skin at bedtime., Disp: 10 mL, Rfl: 1 .  Insulin Lispro, Human, (HUMALOG [place]), Inject 15 units of lipase into the skin See admin instructions. Per sliding scale, Disp: , Rfl:  .  JANUMET 50-1000 MG tablet, TAKE 1 TABLET TWICE A DAY, Disp: 180 tablet, Rfl: 3 .  temazepam (RESTORIL) 15 MG capsule, Take 1 capsule (15 mg total) by mouth at bedtime as needed for sleep., Disp: 30 capsule, Rfl: 3 .  valsartan (DIOVAN) 160 MG tablet, TAKE 1 TABLET DAILY, Disp: 90 tablet, Rfl: 4 .  varenicline (CHANTIX CONTINUING MONTH PAK) 1 MG tablet, Take 1 tablet (1 mg total) by mouth 2 (two) times daily., Disp: 180 tablet, Rfl: 1 .  LANTUS SOLOSTAR 100 UNIT/ML Solostar Pen, INJECT 50 UNITS UNDER THE SKIN AT BEDTIME, Disp: 135 mL, Rfl: 3   Allergies  Allergen Reactions  . Metformin And Related Diarrhea     Review of Systems  Constitutional: Negative.   Eyes: Negative for blurred  vision.  Respiratory: Negative.   Cardiovascular: Negative for chest pain, palpitations and leg swelling.  Musculoskeletal: Negative.   Neurological: Negative.  Negative for dizziness and headaches.     Today's Vitals   11/01/19 0849  BP: 120/78  Pulse: 71  Temp: 98.4 F (36.9 C)  TempSrc: Oral  Weight: 254 lb 6.4 oz (115.4 kg)  Height: 5' 10.6" (1.793 m)  PainSc: 0-No pain   Body mass index is 35.88 kg/m.   Objective:  Physical Exam Vitals signs reviewed.  Constitutional:      General: He is not in acute distress.    Appearance: Normal appearance. He is obese.  Cardiovascular:     Rate and Rhythm: Normal rate and regular rhythm.     Pulses: Normal pulses.     Heart sounds: Normal heart sounds. No murmur.  Pulmonary:     Effort: Pulmonary effort is normal.     Breath sounds: Normal breath sounds.  Skin:    General: Skin is warm and dry.  Neurological:     General: No focal deficit present.     Mental Status: He is alert and oriented to person, place, and time.  Psychiatric:        Mood and Affect: Mood normal.        Behavior: Behavior normal.        Thought Content: Thought content normal.        Judgment: Judgment normal.         Assessment And Plan:     1. Controlled type 2 diabetes mellitus with hyperglycemia, with long-term current use of insulin (HCC)  Chronic, controlled  With the current pandemic he is at home more  Continue with current medications  Encouraged to limit intake of sugary foods and drinks  Encouraged to increase physical activity to 150 minutes per week - Hemoglobin A1c - CMP14 + Anion Gap  2. Essential hypertension . B/P is controlled.  . CMP ordered to check renal function.  . The importance of regular exercise and dietary modification was stressed to the patient.  . Stressed importance of losing ten percent of her  body weight to help with B/P   - CMP14 + Anion Gap  3. Mixed hyperlipidemia  Chronic,  controlled  Continue with current medications - Lipid panel  4. Encounter for smoking cessation counseling  Continue with chantix daily doing fair due to stress is a bit more challenging     [name], FNP    THE PATIENT IS ENCOURAGED TO PRACTICE SOCIAL DISTANCING DUE TO THE COVID-19 PANDEMIC.

## 2019-11-28 ENCOUNTER — Ambulatory Visit (INDEPENDENT_AMBULATORY_CARE_PROVIDER_SITE_OTHER): Admitting: Podiatry

## 2019-11-28 ENCOUNTER — Encounter: Payer: Self-pay | Admitting: Podiatry

## 2019-11-28 ENCOUNTER — Other Ambulatory Visit: Payer: Self-pay

## 2019-11-28 DIAGNOSIS — L84 Corns and callosities: Secondary | ICD-10-CM | POA: Diagnosis not present

## 2019-11-28 DIAGNOSIS — E1165 Type 2 diabetes mellitus with hyperglycemia: Secondary | ICD-10-CM | POA: Diagnosis not present

## 2019-11-28 DIAGNOSIS — Z794 Long term (current) use of insulin: Secondary | ICD-10-CM | POA: Diagnosis not present

## 2019-11-28 DIAGNOSIS — L732 Hidradenitis suppurativa: Secondary | ICD-10-CM | POA: Insufficient documentation

## 2019-11-28 NOTE — Patient Instructions (Signed)
Diabetes Mellitus and Foot Care Foot care is an important part of your health, especially when you have diabetes. Diabetes may cause you to have problems because of poor blood flow (circulation) to your feet and legs, which can cause your skin to:  Become thinner and drier.  Break more easily.  Heal more slowly.  Peel and crack. You may also have nerve damage (neuropathy) in your legs and feet, causing decreased feeling in them. This means that you may not notice minor injuries to your feet that could lead to more serious problems. Noticing and addressing any potential problems early is the best way to prevent future foot problems. How to care for your feet Foot hygiene  Wash your feet daily with warm water and mild soap. Do not use hot water. Then, pat your feet and the areas between your toes until they are completely dry. Do not soak your feet as this can dry your skin.  Trim your toenails straight across. Do not dig under them or around the cuticle. File the edges of your nails with an emery board or nail file.  Apply a moisturizing lotion or petroleum jelly to the skin on your feet and to dry, brittle toenails. Use lotion that does not contain alcohol and is unscented. Do not apply lotion between your toes. Shoes and socks  Wear clean socks or stockings every day. Make sure they are not too tight. Do not wear knee-high stockings since they may decrease blood flow to your legs.  Wear shoes that fit properly and have enough cushioning. Always look in your shoes before you put them on to be sure there are no objects inside.  To break in new shoes, wear them for just a few hours a day. This prevents injuries on your feet. Wounds, scrapes, corns, and calluses  Check your feet daily for blisters, cuts, bruises, sores, and redness. If you cannot see the bottom of your feet, use a mirror or ask someone for help.  Do not cut corns or calluses or try to remove them with medicine.  If you  find a minor scrape, cut, or break in the skin on your feet, keep it and the skin around it clean and dry. You may clean these areas with mild soap and water. Do not clean the area with peroxide, alcohol, or iodine.  If you have a wound, scrape, corn, or callus on your foot, look at it several times a day to make sure it is healing and not infected. Check for: ? Redness, swelling, or pain. ? Fluid or blood. ? Warmth. ? Pus or a bad smell. General instructions  Do not cross your legs. This may decrease blood flow to your feet.  Do not use heating pads or hot water bottles on your feet. They may burn your skin. If you have lost feeling in your feet or legs, you may not know this is happening until it is too late.  Protect your feet from hot and cold by wearing shoes, such as at the beach or on hot pavement.  Schedule a complete foot exam at least once a year (annually) or more often if you have foot problems. If you have foot problems, report any cuts, sores, or bruises to your health care provider immediately. Contact a health care provider if:  You have a medical condition that increases your risk of infection and you have any cuts, sores, or bruises on your feet.  You have an injury that is not   healing.  You have redness on your legs or feet.  You feel burning or tingling in your legs or feet.  You have pain or cramps in your legs and feet.  Your legs or feet are numb.  Your feet always feel cold.  You have pain around a toenail. Get help right away if:  You have a wound, scrape, corn, or callus on your foot and: ? You have pain, swelling, or redness that gets worse. ? You have fluid or blood coming from the wound, scrape, corn, or callus. ? Your wound, scrape, corn, or callus feels warm to the touch. ? You have pus or a bad smell coming from the wound, scrape, corn, or callus. ? You have a fever. ? You have a red line going up your leg. Summary  Check your feet every day  for cuts, sores, red spots, swelling, and blisters.  Moisturize feet and legs daily.  Wear shoes that fit properly and have enough cushioning.  If you have foot problems, report any cuts, sores, or bruises to your health care provider immediately.  Schedule a complete foot exam at least once a year (annually) or more often if you have foot problems. This information is not intended to replace advice given to you by your health care provider. Make sure you discuss any questions you have with your health care provider. Document Released: 12/04/2000 Document Revised: 01/19/2018 Document Reviewed: 01/08/2017 Elsevier Patient Education  2020 [employer]  

## 2019-12-03 NOTE — Progress Notes (Signed)
Subjective: Stephen Sampson presents to clinic with cc of painful calluses b/l feet which are aggravated when weightbearing with and without shoe gear.  This pain limits his daily activities. Pain symptoms resolve with periodic professional debridement.  He voices no new pedal problems on today's visit.   [name], FNP is his PCP.   Medications reviewed in chart.  Allergies  Allergen Reactions  . Metformin And Related Diarrhea     Objective: There were no vitals filed for this visit.  Physical Examination:  Vascular  Examination: Capillary refill time immediate b/l.  Palpable DP/PT pulses b/l.  Digital hair present b/l.  No edema noted b/l.  Skin temperature gradient WNL b/l.  Dermatological Examination: Skin with normal turgor, texture and tone b/l.  No open wounds b/l.  No interdigital macerations noted b/l.  Hyperkeratotic lesion submet head 5 b/l with tenderness to palpation. No edema, no erythema, no drainage, no flocculence.   Musculoskeletal Examination: Muscle strength 5/5 to all muscle groups b/l.  No pain, crepitus or joint discomfort with active/passive ROM.  Neurological Examination: Sensation intact 5/5 b/l with 10 gram monofilament.  Vibratory sensation intact b/l.  Assessment: 1.  Calluses submet head 5 b/l 2.  NIDDM  Plan: 1. Continue diabetic foot care principles. Calluses pared submetatarsal head 5 b/l utilizing sterile scalpel blade without incident. Continue soft, supportive shoe gear daily. Report any pedal injuries to medical professional. Follow up 3 months. Patient/POA to call should there be a question/concern in there interim.

## 2019-12-05 ENCOUNTER — Encounter: Payer: Self-pay | Admitting: Nurse Practitioner

## 2019-12-05 LAB — HM DIABETES EYE EXAM
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
## 2019-12-11 ENCOUNTER — Ambulatory Visit (INDEPENDENT_AMBULATORY_CARE_PROVIDER_SITE_OTHER): Admitting: Ophthalmology

## 2019-12-11 ENCOUNTER — Other Ambulatory Visit: Payer: Self-pay

## 2019-12-11 ENCOUNTER — Encounter (INDEPENDENT_AMBULATORY_CARE_PROVIDER_SITE_OTHER): Payer: Self-pay | Admitting: Ophthalmology

## 2019-12-11 DIAGNOSIS — H33301 Unspecified retinal break, right eye: Secondary | ICD-10-CM | POA: Diagnosis not present

## 2019-12-11 DIAGNOSIS — H35033 Hypertensive retinopathy, bilateral: Secondary | ICD-10-CM

## 2019-12-11 DIAGNOSIS — H35461 Secondary vitreoretinal degeneration, right eye: Secondary | ICD-10-CM | POA: Diagnosis not present

## 2019-12-11 DIAGNOSIS — Z961 Presence of intraocular lens: Secondary | ICD-10-CM

## 2019-12-11 DIAGNOSIS — E119 Type 2 diabetes mellitus without complications: Secondary | ICD-10-CM | POA: Diagnosis not present

## 2019-12-11 DIAGNOSIS — H3581 Retinal edema: Secondary | ICD-10-CM

## 2019-12-11 DIAGNOSIS — I10 Essential (primary) hypertension: Secondary | ICD-10-CM

## 2019-12-11 LAB — HM DIABETES EYE EXAM

## 2019-12-11 MED ORDER — PREDNISOLONE ACETATE 1 % OP SUSP: 1.0000 [drp] | Freq: Four times a day (QID) | OPHTHALMIC | 0 refills | Status: AC

## 2019-12-12 ENCOUNTER — Encounter: Payer: Self-pay | Admitting: Nurse Practitioner

## 2019-12-20 ENCOUNTER — Encounter: Payer: Self-pay | Admitting: Nurse Practitioner

## 2020-01-02 ENCOUNTER — Encounter (INDEPENDENT_AMBULATORY_CARE_PROVIDER_SITE_OTHER): Admitting: Ophthalmology

## 2020-01-03 ENCOUNTER — Other Ambulatory Visit: Payer: Self-pay

## 2020-01-03 ENCOUNTER — Encounter (INDEPENDENT_AMBULATORY_CARE_PROVIDER_SITE_OTHER): Payer: Self-pay | Admitting: Ophthalmology

## 2020-01-03 ENCOUNTER — Ambulatory Visit (INDEPENDENT_AMBULATORY_CARE_PROVIDER_SITE_OTHER): Admitting: Ophthalmology

## 2020-01-03 DIAGNOSIS — H33301 Unspecified retinal break, right eye: Secondary | ICD-10-CM

## 2020-01-03 DIAGNOSIS — Z961 Presence of intraocular lens: Secondary | ICD-10-CM

## 2020-01-03 DIAGNOSIS — H35461 Secondary vitreoretinal degeneration, right eye: Secondary | ICD-10-CM

## 2020-01-03 DIAGNOSIS — H35033 Hypertensive retinopathy, bilateral: Secondary | ICD-10-CM

## 2020-01-03 DIAGNOSIS — E119 Type 2 diabetes mellitus without complications: Secondary | ICD-10-CM

## 2020-01-03 DIAGNOSIS — H3581 Retinal edema: Secondary | ICD-10-CM | POA: Diagnosis not present

## 2020-01-03 DIAGNOSIS — I10 Essential (primary) hypertension: Secondary | ICD-10-CM

## 2020-01-03 NOTE — Progress Notes (Signed)
[facility]  01/03/2020     CHIEF COMPLAINT Patient presents for Retina Follow Up   HISTORY OF PRESENT ILLNESS: Stephen Sampson is a 59 y.o. male who presents to the clinic today for:   HPI    Retina Follow Up    Patient presents with  Other.  In right eye.  This started weeks ago.  Severity is moderate.  Duration of weeks.  Since onset it is stable.  I, the attending physician,  performed the HPI with the patient and updated documentation appropriately.          Comments    Pt states vision is about the same.  Denies eye pain.  Denies any new or worsening floaters or fol OU.       Last edited by Stephen Caffey, MD on 01/03/2020  4:45 PM. (History)    pt states she has had no problems since the laser procedure, no new fol or floaters, no pain or decrease in vision   Referring physician: Minette Sampson, Stephen Sampson [address],  Stephen Sampson [address]  HISTORICAL INFORMATION:   Selected notes from the MEDICAL RECORD NUMBER Referred by Dr. Zenia Sampson for concern of Operculated Tear OD.   CURRENT MEDICATIONS: No current outpatient medications on file. (Ophthalmic Drugs)   No current facility-administered medications for this visit. (Ophthalmic Drugs)   Current Outpatient Medications (Other)  Medication Sig  . aspirin EC 81 MG tablet Take 81 mg by mouth daily.  [name] atorvastatin (LIPITOR) 40 MG tablet TAKE 1 TABLET EVERY EVENING  . B-D UF III MINI PEN NEEDLES 31G X 5 MM MISC USE AS DIRECTED  . clindamycin (CLEOCIN T) 1 % lotion Apply  as directed to affected area twice a day  . doxycycline (VIBRAMYCIN) 100 MG capsule Take 100 mg by mouth 2 (two) times daily.  . Dulaglutide (TRULICITY) 1.5 0000000 SOPN Inject 1.5 mg into the skin once a week. Inject 1.5mg  weekly  . glucose blood (FREESTYLE LITE) test strip Use as instructed  . HUMALOG KWIKPEN 100 UNIT/ML KwikPen INJECT UNDER THE SKIN BEFORE MEALS AS PER INSULIN SLIDING SCALE PROTOCOL   . insulin glargine (LANTUS) 100 UNIT/ML injection Inject 0.48 mLs (48 Units total) into the skin at bedtime.  . Insulin Lispro, Human, (HUMALOG [place]) Inject 15 units of lipase into the skin See admin instructions. Per sliding scale  . JANUMET 50-1000 MG tablet TAKE 1 TABLET TWICE A DAY  . temazepam (RESTORIL) 15 MG capsule Take 1 capsule (15 mg total) by mouth at bedtime as needed for sleep.  . valsartan (DIOVAN) 160 MG tablet TAKE 1 TABLET DAILY  . varenicline (CHANTIX CONTINUING MONTH PAK) 1 MG tablet Take 1 tablet (1 mg total) by mouth 2 (two) times daily.   No current facility-administered medications for this visit. (Other)      REVIEW OF SYSTEMS: ROS    Positive for: Eyes   Negative for: Constitutional, Gastrointestinal, Neurological, Skin, Genitourinary, Musculoskeletal, HENT, Endocrine, Cardiovascular, Respiratory, Psychiatric, Allergic/Imm, Heme/Lymph   Last edited by Stephen Sampson on 01/03/2020  2:57 PM. (History)       ALLERGIES Allergies  Allergen Reactions  . Metformin And Related Diarrhea    PAST MEDICAL HISTORY Past Medical History:  Diagnosis Date  . Diabetes mellitus without complication ([place])   . Hypertension    Past Surgical History:  Procedure Laterality Date  . CARDIAC CATHETERIZATION      FAMILY HISTORY History reviewed. No pertinent family  history.  SOCIAL HISTORY Social History   Tobacco Use  . Smoking status: Current Every Day Smoker    Packs/day: 1.00    Types: Cigarettes  . Smokeless tobacco: Never Used  Substance Use Topics  . Alcohol use: Yes    Comment: occ   . Drug use: No         OPHTHALMIC EXAM:  Base Eye Exam    Visual Acuity (Snellen - Linear)      Right Left   Dist [place] 20/20 -1 20/20 -1       Tonometry (Tonopen, 2:59 PM)      Right Left   Pressure 17 15       Pupils      Dark Light Shape React APD   Right 3 2 Round Minimal 0   Left 3 2 Round Minimal 0       Visual Fields      Left Right    Full Full        Extraocular Movement      Right Left    Full Full       Neuro/Psych    Oriented x3: Yes   Mood/Affect: Normal       Dilation    Both eyes: 1.0% Mydriacyl, 2.5% Phenylephrine @ 2:59 PM        Slit Lamp and Fundus Exam    Slit Lamp Exam      Right Left   Lids/Lashes Dermatochalasis - upper lid Dermatochalasis - upper lid   Conjunctiva/Sclera White and quiet White and quiet   Cornea Trace Punctate epithelial erosions, well healed temporal cataract wounds Trace Punctate epithelial erosions, well healed temporal cataract wounds   Anterior Chamber Deep and quiet Deep and quiet   Iris Round and dilated, No NVI Round and dilated, No NVI, mild iridodonesis   Lens PC IOL in good position with open PC PC IOL in good position, trace Posterior capsular opacification   Vitreous Mild Vitreous syneresis Mild Vitreous syneresis       Fundus Exam      Right Left   Disc Pink and Sharp, Compact Pink and Sharp, Compact   C/D Ratio 0.1 0.2   Macula Flat, Blunted foveal reflex, Retinal pigment epithelial mottling, No heme or edema Flat, Blunted foveal reflex, Retinal pigment epithelial mottling, No heme or edema   Vessels Mild Vascular attenuation, mild Tortuousity Mild Vascular attenuation, mild Tortuousity   Periphery Attached; focal, punctate pigment clump at 0200 equator; linear/radial pigmentation with VR tuft/retinal defect at 0300 -- good laser in place surrounding both lesions Attached, mild IRH inferiorly          IMAGING AND PROCEDURES  Imaging and Procedures for @TODAY @  OCT, Retina - OU - Both Eyes       Right Eye Quality was good. Central Foveal Thickness: 320. Progression has been stable. Findings include normal foveal contour, no IRF, no SRF, vitreomacular adhesion .   Left Eye Quality was good. Central Foveal Thickness: 305. Progression has been stable. Findings include normal foveal contour, no IRF, no SRF, vitreomacular adhesion .   Notes *Images captured and  stored on drive  Diagnosis / Impression:  NFP, no IRF/SRF OU VMA OU  Clinical management:  See below  Abbreviations: NFP - Normal foveal profile. CME - cystoid macular edema. PED - pigment epithelial detachment. IRF - intraretinal fluid. SRF - subretinal fluid. EZ - ellipsoid zone. ERM - epiretinal membrane. ORA - outer retinal atrophy. ORT - outer retinal tubulation.  SRHM - subretinal hyper-reflective material                 ASSESSMENT/PLAN:    ICD-10-CM   1. 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 Right retinal defect  H33.301   3. Retinal edema  H35.81 OCT, Retina - OU - Both Eyes  4. Diabetes mellitus type 2 without retinopathy ([place])  E11.9   5. Essential hypertension  I10   6. Hypertensive retinopathy of both eyes  H35.033   7. Pseudophakia of both eyes  Z96.1    1,2. Vitreoretinal tuft / retinal defect, right eye.    - The incidence, risk factors, and natural history of retinal defect was discussed with patient.    - Potential treatment options including laser retinopexy and cryotherapy discussed with patient.  - radial, linear pigmentation at 0300 w/ associated VR tuft / retinal defect along anterior aspect  - s/p laser retinopexy OD (12.21.20) -- good laser in place  - completed PF QID OD x7 days  - f/u in 3 months  3. No retinal edema on exam or OCT  4. Diabetes mellitus, type 2 without retinopathy  - The incidence, risk factors for progression, natural history and treatment options for diabetic retinopathy  were discussed with patient.    - The need for close monitoring of blood glucose, blood pressure, and serum lipids, avoiding cigarette or any type of tobacco, and the need for long term follow up was also discussed with patient.  - f/u in 1 year, sooner prn  5,6. Hypertensive retinopathy OU  - discussed importance of tight BP control  - monitor  7. Pseudophakia OU  - s/p CE/IOL OU (Dr. Katy Fitch, ~2005)  - beautiful surgeries, doing well  -  monitor   Ophthalmic Meds Ordered this visit:  No orders of the defined types were placed in this encounter.      Return in about 3 months (around 04/02/2020) for f/u VR tuft / retinal defect OD, DFE, OCT.  There are no Patient Instructions on file for this visit.   Explained the diagnoses, plan, and follow up with the patient and they expressed understanding.  Patient expressed understanding of the importance of proper follow up care.   This document serves as a record of services personally performed by Gardiner Sleeper, MD, PhD. It was created on their behalf by Ernest Mallick, OA, an ophthalmic assistant. The creation of this record is the provider's dictation and/or activities during the visit.    Electronically signed by: Ernest Mallick, OA 01.13.2021 11:43 PM   Gardiner Sleeper, M.D., Ph.D. Diseases & Surgery of the Retina and Vitreous [facility]  I have reviewed the above documentation for accuracy and completeness, and I agree with the above. Gardiner Sleeper, M.D., Ph.D. 01/04/20 11:44 PM  Abbreviations: M myopia (nearsighted); A astigmatism; H hyperopia (farsighted); P presbyopia; Mrx spectacle prescription;  CTL contact lenses; OD right eye; OS left eye; OU both eyes  XT exotropia; ET esotropia; PEK punctate epithelial keratitis; PEE punctate epithelial erosions; DES dry eye syndrome; MGD meibomian gland dysfunction; ATs artificial tears; PFAT's preservative free artificial tears; [place] nuclear sclerotic cataract; PSC posterior subcapsular cataract; ERM epi-retinal membrane; PVD posterior vitreous detachment; RD retinal detachment; DM diabetes mellitus; DR diabetic retinopathy; NPDR non-proliferative diabetic retinopathy; PDR proliferative diabetic retinopathy; CSME clinically significant macular edema; DME diabetic macular edema; dbh dot blot hemorrhages; CWS cotton wool spot; POAG primary open angle glaucoma; C/D cup-to-disc  ratio; HVF humphrey visual field;  GVF goldmann visual field; OCT optical coherence tomography; IOP intraocular pressure; BRVO Branch retinal vein occlusion; CRVO central retinal vein occlusion; CRAO central retinal artery occlusion; BRAO branch retinal artery occlusion; RT retinal tear; SB scleral buckle; PPV pars plana vitrectomy; VH Vitreous hemorrhage; PRP panretinal laser photocoagulation; IVK intravitreal kenalog; VMT vitreomacular traction; MH Macular hole;  NVD neovascularization of the disc; NVE neovascularization elsewhere; AREDS age related eye disease study; ARMD age related macular degeneration; POAG primary open angle glaucoma; EBMD epithelial/anterior basement membrane dystrophy; ACIOL anterior chamber intraocular lens; IOL intraocular lens; PCIOL posterior chamber intraocular lens; Phaco/IOL phacoemulsification with intraocular lens placement; [place] photorefractive keratectomy; LASIK laser assisted in situ keratomileusis; HTN hypertension; DM diabetes mellitus; COPD chronic obstructive pulmonary disease

## 2020-02-06 ENCOUNTER — Other Ambulatory Visit: Payer: Self-pay

## 2020-02-06 ENCOUNTER — Ambulatory Visit (INDEPENDENT_AMBULATORY_CARE_PROVIDER_SITE_OTHER): Admitting: Nurse Practitioner

## 2020-02-06 ENCOUNTER — Encounter: Payer: Self-pay | Admitting: Nurse Practitioner

## 2020-02-06 VITALS — BP 114/80 | HR 84 | Temp 97.5°F | Ht 70.6 in | Wt 251.0 lb

## 2020-02-06 DIAGNOSIS — E1165 Type 2 diabetes mellitus with hyperglycemia: Secondary | ICD-10-CM

## 2020-02-06 DIAGNOSIS — I10 Essential (primary) hypertension: Secondary | ICD-10-CM | POA: Diagnosis not present

## 2020-02-06 DIAGNOSIS — Z794 Long term (current) use of insulin: Secondary | ICD-10-CM | POA: Diagnosis not present

## 2020-02-06 DIAGNOSIS — E782 Mixed hyperlipidemia: Secondary | ICD-10-CM | POA: Diagnosis not present

## 2020-02-06 NOTE — Progress Notes (Signed)
Subjective:     Patient ID: Stephen Sampson , male    DOB: October 16, 1961 , 59 y.o.   MRN: [id number]   Chief Complaint  Patient presents with  . Diabetes    Subjective:     Patient ID: Stephen Sampson , male    DOB: 1961-05-23 , 59 y.o.   MRN: [id number]  Chief Complaint  Patient presents with  . Diabetes   HPI  He has not been exercising as much due to the weather with the cold and rain.     Lantus 48 units at night and using 20-25 units before every meal.    He has had the 1st dose of Moderna on 01/23/2020 he is due for his next dose 03/09/2020  Diabetes He presents for his follow-up diabetic visit. He has type 2 diabetes mellitus. His disease course has been improving. Pertinent negatives for hypoglycemia include no dizziness or headaches. Pertinent negatives for diabetes include no blurred vision, no chest pain and no fatigue. Symptoms are stable. Risk factors for coronary artery disease include obesity and sedentary lifestyle. Current diabetic treatment includes oral agent (dual therapy). He is compliant with treatment all of the time. His weight is decreasing steadily. He is following a generally healthy (trying to watch portions) diet. When asked about meal planning, he reported none. He has not had a previous visit with a dietitian. He participates in exercise intermittently. His overall blood glucose range is 110-130 mg/dl. (Blood sugar 130's, he is trying to be more mindful of the grazing. ) An ACE inhibitor/angiotensin II receptor blocker is being taken. He does not see a podiatrist.Eye exam is current.     Past Medical History:  Diagnosis Date  . Diabetes mellitus without complication ([place])   . Hypertension      No family history on file.   Current Outpatient Medications:  .  aspirin EC 81 MG tablet, Take 81 mg by mouth daily., Disp: , Rfl:  .  atorvastatin (LIPITOR) 40 MG tablet, TAKE 1 TABLET EVERY EVENING, Disp: 90 tablet, Rfl: 3 .  B-D UF III MINI PEN  NEEDLES 31G X 5 MM MISC, USE AS DIRECTED, Disp: 100 each, Rfl: 3 .  clindamycin (CLEOCIN T) 1 % lotion, Apply  as directed to affected area twice a day, Disp: , Rfl:  .  doxycycline (VIBRAMYCIN) 100 MG capsule, Take 100 mg by mouth 2 (two) times daily., Disp: , Rfl:  .  Dulaglutide (TRULICITY) 1.5 0000000 SOPN, Inject 1.5 mg into the skin once a week. Inject 1.5mg  weekly, Disp: 12 pen, Rfl: 1 .  glucose blood (FREESTYLE LITE) test strip, Use as instructed, Disp: 300 each, Rfl: 4 .  HUMALOG KWIKPEN 100 UNIT/ML KwikPen, INJECT UNDER THE SKIN BEFORE MEALS AS PER INSULIN SLIDING SCALE PROTOCOL, Disp: 45 mL, Rfl: 3 .  insulin glargine (LANTUS) 100 UNIT/ML injection, Inject 0.48 mLs (48 Units total) into the skin at bedtime., Disp: 10 mL, Rfl: 1 .  Insulin Lispro, Human, (HUMALOG [place]), Inject 15 units of lipase into the skin See admin instructions. Per sliding scale, Disp: , Rfl:  .  JANUMET 50-1000 MG tablet, TAKE 1 TABLET TWICE A DAY, Disp: 180 tablet, Rfl: 3 .  temazepam (RESTORIL) 15 MG capsule, Take 1 capsule (15 mg total) by mouth at bedtime as needed for sleep., Disp: 30 capsule, Rfl: 5 .  valsartan (DIOVAN) 160 MG tablet, TAKE 1 TABLET DAILY, Disp: 90 tablet, Rfl: 4 .  varenicline (CHANTIX CONTINUING MONTH PAK) 1 MG tablet, Take 1  tablet (1 mg total) by mouth 2 (two) times daily. (Patient not taking: Reported on 02/06/2020), Disp: 180 tablet, Rfl: 1   Allergies  Allergen Reactions  . Metformin And Related Diarrhea     Review of Systems  Constitutional: Negative.  Negative for fatigue.  Eyes: Negative for blurred vision.  Respiratory: Negative.   Cardiovascular: Negative for chest pain, palpitations and leg swelling.  Musculoskeletal: Negative.   Neurological: Negative.  Negative for dizziness and headaches.     Today's Vitals   02/06/20 0829  BP: 114/80  Pulse: 84  Temp: (!) 97.5 F (36.4 C)  TempSrc: Oral  Weight: 251 lb (113.9 kg)  Height: 5' 10.6" (1.793 m)  PainSc: 0-No pain    Body mass index is 35.41 kg/m.   Objective:  Physical Exam Vitals reviewed.  Constitutional:      General: He is not in acute distress.    Appearance: Normal appearance. He is obese.  Cardiovascular:     Rate and Rhythm: Normal rate and regular rhythm.     Pulses: Normal pulses.     Heart sounds: Normal heart sounds. No murmur.  Pulmonary:     Effort: Pulmonary effort is normal.     Breath sounds: Normal breath sounds.  Skin:    General: Skin is warm and dry.  Neurological:     General: No focal deficit present.     Mental Status: He is alert and oriented to person, place, and time.  Psychiatric:        Mood and Affect: Mood normal.        Behavior: Behavior normal.        Thought Content: Thought content normal.        Judgment: Judgment normal.         Assessment And Plan:     1. Controlled type 2 diabetes mellitus with hyperglycemia, with long-term current use of insulin (HCC)  Chronic, controlled  With the current pandemic he is at home more  Continue with current medications  Encouraged to limit intake of sugary foods and drinks - Hemoglobin A1c - CMP14 + Anion Gap  2. Essential hypertension . B/P is well controlled.  . CMP ordered to check renal function.  - CMP14 + Anion Gap  3. Mixed hyperlipidemia  Chronic, controlled  Continue with current medications - Lipid panel  Does not need any refills on his medications at this time.    [name], FNP    THE PATIENT IS ENCOURAGED TO PRACTICE SOCIAL DISTANCING DUE TO THE COVID-19 PANDEMIC.

## 2020-02-07 LAB — LIPID PANEL
Chol/HDL Ratio: 4.5 ratio (ref 0.0–5.0)
Cholesterol, Total: 109 mg/dL (ref 100–199)
HDL: 24 mg/dL — ABNORMAL LOW (ref >39)
LDL Chol Calc (NIH): 57 mg/dL (ref 0–99)
Triglycerides: 166 mg/dL — ABNORMAL HIGH (ref 0–149)
VLDL Cholesterol Cal: 28 mg/dL (ref 5–40)

## 2020-02-07 LAB — CMP14+EGFR
ALT: 29 IU/L (ref 0–44)
AST: 12 IU/L (ref 0–40)
Albumin/Globulin Ratio: 1.5 (ref 1.2–2.2)
Albumin: 3.9 g/dL (ref 3.8–4.9)
Alkaline Phosphatase: 83 IU/L (ref 39–117)
BUN/Creatinine Ratio: 14 (ref 9–20)
BUN: 13 mg/dL (ref 6–24)
Bilirubin Total: 0.3 mg/dL (ref 0.0–1.2)
CO2: 21 mmol/L (ref 20–29)
Calcium: 8.8 mg/dL (ref 8.7–10.2)
Chloride: 108 mmol/L — ABNORMAL HIGH (ref 96–106)
Creatinine, Ser: 0.94 mg/dL (ref 0.76–1.27)
GFR calc Af Amer: 102 mL/min/{1.73_m2} (ref >59)
GFR calc non Af Amer: 88 mL/min/{1.73_m2} (ref >59)
Globulin, Total: 2.6 g/dL (ref 1.5–4.5)
Glucose: 147 mg/dL — ABNORMAL HIGH (ref 65–99)
Potassium: 4.2 mmol/L (ref 3.5–5.2)
Sodium: 141 mmol/L (ref 134–144)
Total Protein: 6.5 g/dL (ref 6.0–8.5)

## 2020-02-07 LAB — HEMOGLOBIN A1C
Est. average glucose Bld gHb Est-mCnc: 160 mg/dL
Hgb A1c MFr Bld: 7.2 % — ABNORMAL HIGH (ref 4.8–5.6)

## 2020-02-27 ENCOUNTER — Other Ambulatory Visit: Payer: Self-pay | Admitting: Nurse Practitioner

## 2020-02-27 ENCOUNTER — Encounter: Payer: Self-pay | Admitting: Podiatry

## 2020-02-27 ENCOUNTER — Ambulatory Visit (INDEPENDENT_AMBULATORY_CARE_PROVIDER_SITE_OTHER): Admitting: Podiatry

## 2020-02-27 ENCOUNTER — Other Ambulatory Visit: Payer: Self-pay

## 2020-02-27 VITALS — Temp 97.1°F

## 2020-02-27 DIAGNOSIS — Z794 Long term (current) use of insulin: Secondary | ICD-10-CM

## 2020-02-27 DIAGNOSIS — L84 Corns and callosities: Secondary | ICD-10-CM

## 2020-02-27 DIAGNOSIS — E1165 Type 2 diabetes mellitus with hyperglycemia: Secondary | ICD-10-CM | POA: Diagnosis not present

## 2020-02-27 DIAGNOSIS — E1169 Type 2 diabetes mellitus with other specified complication: Secondary | ICD-10-CM

## 2020-02-27 NOTE — Patient Instructions (Signed)
Diabetes Mellitus and Foot Care Foot care is an important part of your health, especially when you have diabetes. Diabetes may cause you to have problems because of poor blood flow (circulation) to your feet and legs, which can cause your skin to:  Become thinner and drier.  Break more easily.  Heal more slowly.  Peel and crack. You may also have nerve damage (neuropathy) in your legs and feet, causing decreased feeling in them. This means that you may not notice minor injuries to your feet that could lead to more serious problems. Noticing and addressing any potential problems early is the best way to prevent future foot problems. How to care for your feet Foot hygiene  Wash your feet daily with warm water and mild soap. Do not use hot water. Then, pat your feet and the areas between your toes until they are completely dry. Do not soak your feet as this can dry your skin.  Trim your toenails straight across. Do not dig under them or around the cuticle. File the edges of your nails with an emery board or nail file.  Apply a moisturizing lotion or petroleum jelly to the skin on your feet and to dry, brittle toenails. Use lotion that does not contain alcohol and is unscented. Do not apply lotion between your toes. Shoes and socks  Wear clean socks or stockings every day. Make sure they are not too tight. Do not wear knee-high stockings since they may decrease blood flow to your legs.  Wear shoes that fit properly and have enough cushioning. Always look in your shoes before you put them on to be sure there are no objects inside.  To break in new shoes, wear them for just a few hours a day. This prevents injuries on your feet. Wounds, scrapes, corns, and calluses  Check your feet daily for blisters, cuts, bruises, sores, and redness. If you cannot see the bottom of your feet, use a mirror or ask someone for help.  Do not cut corns or calluses or try to remove them with medicine.  If you  find a minor scrape, cut, or break in the skin on your feet, keep it and the skin around it clean and dry. You may clean these areas with mild soap and water. Do not clean the area with peroxide, alcohol, or iodine.  If you have a wound, scrape, corn, or callus on your foot, look at it several times a day to make sure it is healing and not infected. Check for: ? Redness, swelling, or pain. ? Fluid or blood. ? Warmth. ? Pus or a bad smell. General instructions  Do not cross your legs. This may decrease blood flow to your feet.  Do not use heating pads or hot water bottles on your feet. They may burn your skin. If you have lost feeling in your feet or legs, you may not know this is happening until it is too late.  Protect your feet from hot and cold by wearing shoes, such as at the beach or on hot pavement.  Schedule a complete foot exam at least once a year (annually) or more often if you have foot problems. If you have foot problems, report any cuts, sores, or bruises to your health care provider immediately. Contact a health care provider if:  You have a medical condition that increases your risk of infection and you have any cuts, sores, or bruises on your feet.  You have an injury that is not   healing.  You have redness on your legs or feet.  You feel burning or tingling in your legs or feet.  You have pain or cramps in your legs and feet.  Your legs or feet are numb.  Your feet always feel cold.  You have pain around a toenail. Get help right away if:  You have a wound, scrape, corn, or callus on your foot and: ? You have pain, swelling, or redness that gets worse. ? You have fluid or blood coming from the wound, scrape, corn, or callus. ? Your wound, scrape, corn, or callus feels warm to the touch. ? You have pus or a bad smell coming from the wound, scrape, corn, or callus. ? You have a fever. ? You have a red line going up your leg. Summary  Check your feet every day  for cuts, sores, red spots, swelling, and blisters.  Moisturize feet and legs daily.  Wear shoes that fit properly and have enough cushioning.  If you have foot problems, report any cuts, sores, or bruises to your health care provider immediately.  Schedule a complete foot exam at least once a year (annually) or more often if you have foot problems. This information is not intended to replace advice given to you by your health care provider. Make sure you discuss any questions you have with your health care provider. Document Revised: 08/30/2019 Document Reviewed: 01/08/2017 Elsevier Patient Education  2020 Elsevier Inc.  

## 2020-02-27 NOTE — Progress Notes (Signed)
Subjective: Stephen Sampson presents today for follow up of preventative diabetic foot care and painful callus(es) plantar aspect of both feet. Aggravating factors include weightbearing with and without shoe gear. Pain is relieved with periodic professional debridement.   He voices no new pedal concerns on today's visit.  Allergies  Allergen Reactions  . Metformin And Related Diarrhea     Objective: Vitals:   02/27/20 0824  Temp: (!) 97.1 F (36.2 C)    Pt 59 y.o. year old [name] male WD, WN in NAD. AAO x 3.   Vascular Examination:  Capillary refill time to digits immediate b/l. Palpable DP pulses b/l. Palpable PT pulses b/l. Pedal hair present b/l. Skin temperature gradient within normal limits b/l.  Dermatological Examination: Pedal skin with normal turgor, texture and tone bilaterally. No open wounds bilaterally. No interdigital macerations bilaterally. Hyperkeratotic lesion(s) submet head 5 b/l.  No erythema, no edema, no drainage, no flocculence.   Toenails 1-5 b/l adequate length and well maintained.  Musculoskeletal: Normal muscle strength 5/5 to all lower extremity muscle groups bilaterally, no pain crepitus or joint limitation noted with ROM b/l and hammertoes noted to the  2-5 bilaterally  Neurological: Protective sensation intact 5/5 intact bilaterally with 10g monofilament b/l Vibratory sensation intact b/l  Assessment: 1. Callus   2. Controlled type 2 diabetes mellitus with hyperglycemia, with long-term current use of insulin ([place])    Plan: -Continue diabetic foot care principles. Literature dispensed on today.  -Calluses submet head 5 b/l were debrided without complication or incident. Total number debrided =2. -Patient to continue soft, supportive shoe gear daily. -Patient to report any pedal injuries to medical professional immediately. -Patient/POA to call should there be question/concern in the interim.  Return in about 3 months (around 05/29/2020)  for diabetic nail and callus trim.

## 2020-04-01 NOTE — Progress Notes (Signed)
[facility] Note  04/02/2020     CHIEF COMPLAINT Patient presents for Retina Follow Up   HISTORY OF PRESENT ILLNESS: Stephen Sampson is a 59 y.o. male who presents to the clinic today for:   HPI    Retina Follow Up    Patient presents with  Other.  In right eye.  This started weeks ago.  Severity is moderate.  Duration of weeks.  Since onset it is stable.  I, the attending physician,  performed the HPI with the patient and updated documentation appropriately.          Comments    Pt states his vision is about the same OU.  Pt denies eye pain or discomfort and denies any new or worsening floaters or fol OU.       Last edited by Bernarda Caffey, MD on 04/02/2020  8:05 AM. (History)    pt states his vision is "fine"   Referring physician: Debbra Riding, MD [address] [place],  [place] [address]  HISTORICAL INFORMATION:   Selected notes from the MEDICAL RECORD NUMBER Referred by Dr. Zenia Resides for concern of Operculated Tear OD.   CURRENT MEDICATIONS: No current outpatient medications on file. (Ophthalmic Drugs)   No current facility-administered medications for this visit. (Ophthalmic Drugs)   Current Outpatient Medications (Other)  Medication Sig  . aspirin EC 81 MG tablet Take 81 mg by mouth daily.  [name] atorvastatin (LIPITOR) 40 MG tablet TAKE 1 TABLET EVERY EVENING  . B-D UF III MINI PEN NEEDLES 31G X 5 MM MISC USE AS DIRECTED  . clindamycin (CLEOCIN T) 1 % lotion Apply  as directed to affected area twice a day  . doxycycline (VIBRAMYCIN) 100 MG capsule Take 100 mg by mouth 2 (two) times daily.  [name] doxycycline (VIBRAMYCIN) 50 MG capsule Take 50 mg by mouth 2 (two) times daily.  [name] glucose blood (FREESTYLE LITE) test strip Use as instructed  . HUMALOG KWIKPEN 100 UNIT/ML KwikPen INJECT UNDER THE SKIN BEFORE MEALS AS PER INSULIN SLIDING SCALE PROTOCOL  . insulin glargine (LANTUS) 100 UNIT/ML injection Inject 0.48 mLs (48 Units  total) into the skin at bedtime.  . Insulin Lispro, Human, (HUMALOG [place]) Inject 15 units of lipase into the skin See admin instructions. Per sliding scale  . JANUMET 50-1000 MG tablet TAKE 1 TABLET TWICE A DAY  . temazepam (RESTORIL) 15 MG capsule Take 1 capsule (15 mg total) by mouth at bedtime as needed for sleep.  . TRULICITY 1.5 0000000 SOPN INJECT 1.5 MG INTO THE SKIN ONCE A WEEK  . valsartan (DIOVAN) 160 MG tablet TAKE 1 TABLET DAILY  . varenicline (CHANTIX CONTINUING MONTH PAK) 1 MG tablet Take 1 tablet (1 mg total) by mouth 2 (two) times daily. (Patient not taking: Reported on 02/06/2020)   No current facility-administered medications for this visit. (Other)      REVIEW OF SYSTEMS: ROS    Positive for: Eyes   Negative for: Constitutional, Gastrointestinal, Neurological, Skin, Genitourinary, Musculoskeletal, HENT, Endocrine, Cardiovascular, Respiratory, Psychiatric, Allergic/Imm, Heme/Lymph   Last edited by Doneen Poisson on 04/02/2020  7:44 AM. (History)       ALLERGIES Allergies  Allergen Reactions  . Metformin And Related Diarrhea    PAST MEDICAL HISTORY Past Medical History:  Diagnosis Date  . Diabetes mellitus without complication ([place])   . Hypertension    Past Surgical History:  Procedure Laterality Date  . CARDIAC CATHETERIZATION  FAMILY HISTORY History reviewed. No pertinent family history.  SOCIAL HISTORY Social History   Tobacco Use  . Smoking status: Current Every Day Smoker    Packs/day: 1.00    Types: Cigarettes  . Smokeless tobacco: Never Used  . Tobacco comment: he is no longer doing Chantix  Substance Use Topics  . Alcohol use: Yes    Comment: occ   . Drug use: No         OPHTHALMIC EXAM:  Base Eye Exam    Visual Acuity (Snellen - Linear)      Right Left   Dist [place] 20/40 -1 20/30 -2   Dist ph [place] 20/20 -1 20/20 -1       Tonometry (Tonopen, 7:47 AM)      Right Left   Pressure 15 10       Pupils      Dark Light Shape  React APD   Right 3 2 Round Brisk 0   Left 3 2 Round Brisk 0       Visual Fields      Left Right    Full Full       Extraocular Movement      Right Left    Full Full  Exotropia OS       Neuro/Psych    Oriented x3: Yes   Mood/Affect: Normal       Dilation    Both eyes: 1.0% Mydriacyl, 2.5% Phenylephrine @ 7:47 AM        Slit Lamp and Fundus Exam    Slit Lamp Exam      Right Left   Lids/Lashes Dermatochalasis - upper lid Dermatochalasis - upper lid   Conjunctiva/Sclera White and quiet White and quiet   Cornea 1+Punctate epithelial erosions, well healed temporal cataract wounds 1+Punctate epithelial erosions, well healed temporal cataract wounds   Anterior Chamber Deep and quiet Deep and quiet   Iris Round and dilated, No NVI Round and dilated, No NVI, mild iridodonesis   Lens PC IOL in good position with open PC PC IOL in good position, trace Posterior capsular opacification   Vitreous Mild Vitreous syneresis Mild Vitreous syneresis       Fundus Exam      Right Left   Disc Pink and Sharp, Compact Pink and Sharp, Compact   C/D Ratio 0.1 0.2   Macula Flat, Blunted foveal reflex, mild Retinal pigment epithelial mottling, No heme or edema Flat, Blunted foveal reflex, mild Retinal pigment epithelial mottling, No heme or edema   Vessels Mild Vascular attenuation, mild Tortuousity Mild Vascular attenuation, mild Tortuousity   Periphery Attached; linear/radial pigmentation with VR tuft/retinal defect at 0300 -- good laser in place surrounding Attached, rare MA/IRH          IMAGING AND PROCEDURES  Imaging and Procedures for @TODAY @  OCT, Retina - OU - Both Eyes       Right Eye Quality was good. Central Foveal Thickness: 315. Progression has been stable. Findings include normal foveal contour, no IRF, no SRF, vitreomacular adhesion .   Left Eye Quality was good. Central Foveal Thickness: 304. Progression has been stable. Findings include normal foveal contour, no  IRF, no SRF, vitreomacular adhesion .   Notes *Images captured and stored on drive  Diagnosis / Impression:  NFP, no IRF/SRF OU VMA OU  Clinical management:  See below  Abbreviations: NFP - Normal foveal profile. CME - cystoid macular edema. PED - pigment epithelial detachment. IRF - intraretinal fluid. SRF - subretinal  fluid. EZ - ellipsoid zone. ERM - epiretinal membrane. ORA - outer retinal atrophy. ORT - outer retinal tubulation. SRHM - subretinal hyper-reflective material                 ASSESSMENT/PLAN:    ICD-10-CM   1. 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 Right retinal defect  H33.301   3. Retinal edema  H35.81 OCT, Retina - OU - Both Eyes  4. Diabetes mellitus type 2 without retinopathy ([place])  E11.9   5. Essential hypertension  I10   6. Hypertensive retinopathy of both eyes  H35.033   7. Pseudophakia of both eyes  Z96.1    1,2. Vitreoretinal tuft / retinal defect, right eye.    - radial, linear pigmentation at 0300 w/ associated VR tuft / retinal defect along anterior aspect  - s/p laser retinopexy OD (12.21.20) -- good laser in place  - pt is cleared from a retina standpoint for release to Dr. Wyatt Portela and resumption of primary eye care  3. No retinal edema on exam or OCT  4. Diabetes mellitus, type 2 without retinopathy  - The incidence, risk factors for progression, natural history and treatment options for diabetic retinopathy  were discussed with patient.    - The need for close monitoring of blood glucose, blood pressure, and serum lipids, avoiding cigarette or any type of tobacco, and the need for long term follow up was also discussed with patient.  - f/u in 1 year, sooner prn  5,6. Hypertensive retinopathy OU  - discussed importance of tight BP control  - monitor  7. Pseudophakia OU  - s/p CE/IOL OU (Dr. Katy Fitch, ~2005)  - beautiful surgeries, doing well  - monitor   Ophthalmic Meds Ordered this visit:  No orders of the defined  types were placed in this encounter.      Return if symptoms worsen or fail to improve.  There are no Patient Instructions on file for this visit.   Explained the diagnoses, plan, and follow up with the patient and they expressed understanding.  Patient expressed understanding of the importance of proper follow up care.   This document serves as a record of services personally performed by Gardiner Sleeper, MD, PhD. It was created on their behalf by Estill Bakes, COT an ophthalmic technician. The creation of this record is the provider's dictation and/or activities during the visit.    Electronically signed by: Estill Bakes, COT 04/01/20 @ 8:29 AM   This document serves as a record of services personally performed by Gardiner Sleeper, MD, PhD. It was created on their behalf by Ernest Mallick, OA, an ophthalmic assistant. The creation of this record is the provider's dictation and/or activities during the visit.    Electronically signed by: Ernest Mallick, OA 04.13.2021 8:29 AM.  Gardiner Sleeper, M.D., Ph.D. Diseases & Surgery of the Retina and [place] 04/02/2020   I have reviewed the above documentation for accuracy and completeness, and I agree with the above. Gardiner Sleeper, M.D., Ph.D. 04/02/20 8:30 AM   Abbreviations: M myopia (nearsighted); A astigmatism; H hyperopia (farsighted); P presbyopia; Mrx spectacle prescription;  CTL contact lenses; OD right eye; OS left eye; OU both eyes  XT exotropia; ET esotropia; PEK punctate epithelial keratitis; PEE punctate epithelial erosions; DES dry eye syndrome; MGD meibomian gland dysfunction; ATs artificial tears; PFAT's preservative free artificial tears; [place] nuclear sclerotic cataract; PSC posterior subcapsular cataract; ERM epi-retinal  membrane; PVD posterior vitreous detachment; RD retinal detachment; DM diabetes mellitus; DR diabetic retinopathy; NPDR non-proliferative diabetic retinopathy; PDR  proliferative diabetic retinopathy; CSME clinically significant macular edema; DME diabetic macular edema; dbh dot blot hemorrhages; CWS cotton wool spot; POAG primary open angle glaucoma; C/D cup-to-disc ratio; HVF humphrey visual field; GVF goldmann visual field; OCT optical coherence tomography; IOP intraocular pressure; BRVO Branch retinal vein occlusion; CRVO central retinal vein occlusion; CRAO central retinal artery occlusion; BRAO branch retinal artery occlusion; RT retinal tear; SB scleral buckle; PPV pars plana vitrectomy; VH Vitreous hemorrhage; PRP panretinal laser photocoagulation; IVK intravitreal kenalog; VMT vitreomacular traction; MH Macular hole;  NVD neovascularization of the disc; NVE neovascularization elsewhere; AREDS age related eye disease study; ARMD age related macular degeneration; POAG primary open angle glaucoma; EBMD epithelial/anterior basement membrane dystrophy; ACIOL anterior chamber intraocular lens; IOL intraocular lens; PCIOL posterior chamber intraocular lens; Phaco/IOL phacoemulsification with intraocular lens placement; [place] photorefractive keratectomy; LASIK laser assisted in situ keratomileusis; HTN hypertension; DM diabetes mellitus; COPD chronic obstructive pulmonary disease

## 2020-04-02 ENCOUNTER — Encounter (INDEPENDENT_AMBULATORY_CARE_PROVIDER_SITE_OTHER): Payer: Self-pay | Admitting: Ophthalmology

## 2020-04-02 ENCOUNTER — Ambulatory Visit (INDEPENDENT_AMBULATORY_CARE_PROVIDER_SITE_OTHER): Admitting: Ophthalmology

## 2020-04-02 ENCOUNTER — Other Ambulatory Visit: Payer: Self-pay

## 2020-04-02 DIAGNOSIS — H33301 Unspecified retinal break, right eye: Secondary | ICD-10-CM | POA: Diagnosis not present

## 2020-04-02 DIAGNOSIS — H35461 Secondary vitreoretinal degeneration, right eye: Secondary | ICD-10-CM

## 2020-04-02 DIAGNOSIS — E119 Type 2 diabetes mellitus without complications: Secondary | ICD-10-CM

## 2020-04-02 DIAGNOSIS — H3581 Retinal edema: Secondary | ICD-10-CM

## 2020-04-02 DIAGNOSIS — Z961 Presence of intraocular lens: Secondary | ICD-10-CM

## 2020-04-02 DIAGNOSIS — I10 Essential (primary) hypertension: Secondary | ICD-10-CM

## 2020-04-02 DIAGNOSIS — H35033 Hypertensive retinopathy, bilateral: Secondary | ICD-10-CM

## 2020-04-21 ENCOUNTER — Other Ambulatory Visit: Payer: Self-pay | Admitting: Cardiology

## 2020-04-21 ENCOUNTER — Other Ambulatory Visit: Payer: Self-pay | Admitting: Nurse Practitioner

## 2020-04-28 ENCOUNTER — Other Ambulatory Visit: Payer: Self-pay | Admitting: Nurse Practitioner

## 2020-04-28 DIAGNOSIS — G4700 Insomnia, unspecified: Secondary | ICD-10-CM

## 2020-04-29 NOTE — Telephone Encounter (Signed)
Please refill patient's prescription 

## 2020-05-07 ENCOUNTER — Ambulatory Visit (INDEPENDENT_AMBULATORY_CARE_PROVIDER_SITE_OTHER): Admitting: Nurse Practitioner

## 2020-05-07 ENCOUNTER — Other Ambulatory Visit: Payer: Self-pay

## 2020-05-07 ENCOUNTER — Encounter: Payer: Self-pay | Admitting: Nurse Practitioner

## 2020-05-07 VITALS — BP 142/90 | HR 72 | Temp 98.0°F | Ht 70.6 in | Wt 254.0 lb

## 2020-05-07 DIAGNOSIS — E1169 Type 2 diabetes mellitus with other specified complication: Secondary | ICD-10-CM

## 2020-05-07 DIAGNOSIS — I10 Essential (primary) hypertension: Secondary | ICD-10-CM

## 2020-05-07 DIAGNOSIS — Z794 Long term (current) use of insulin: Secondary | ICD-10-CM | POA: Diagnosis not present

## 2020-05-07 DIAGNOSIS — E782 Mixed hyperlipidemia: Secondary | ICD-10-CM

## 2020-05-07 NOTE — Progress Notes (Signed)
Subjective:     Patient ID: Stephen Sampson , male    DOB: [birth year] , 59 y.o.   MRN: [id number]   Chief Complaint  Patient presents with  . Diabetes  . Hypertension    Subjective:     Patient ID: Stephen Sampson , male    DOB: August 17, 1961 , 59 y.o.   MRN: [id number]  Chief Complaint  Patient presents with  . Diabetes  . Hypertension   HPI  He has not been exercising as much due to the weather with the cold and rain.     Lantus 48 units at night and using 20-25 units before every meal.  He has been under increased stress with his in laws. He had a small tear to the right retina and seen the Retina specialist.   Diabetes He presents for his follow-up diabetic visit. He has type 2 diabetes mellitus. His disease course has been improving. Pertinent negatives for hypoglycemia include no dizziness or headaches. Pertinent negatives for diabetes include no blurred vision, no chest pain and no fatigue. There are no hypoglycemic complications. Symptoms are stable. There are no diabetic complications. Risk factors for coronary artery disease include obesity and sedentary lifestyle. Current diabetic treatment includes oral agent (dual therapy). He is compliant with treatment all of the time. His weight is decreasing steadily. He is following a generally healthy (trying to watch portions) diet. When asked about meal planning, he reported none. He has not had a previous visit with a dietitian. He participates in exercise intermittently. His overall blood glucose range is 110-130 mg/dl. (Blood sugar 100-120 premeal, continues to work from home.  He is doing more yardwork.  ) An ACE inhibitor/angiotensin II receptor blocker is being taken. He does not see a podiatrist.Eye exam is current.  Hypertension This is a chronic problem. The current episode started more than 1 year ago. The problem is uncontrolled. Pertinent negatives include no anxiety, blurred vision, chest pain, headaches or  palpitations. There are no known risk factors for coronary artery disease. Past treatments include angiotensin blockers. There are no compliance problems.  There is no history of angina. There is no history of chronic renal disease.     Past Medical History:  Diagnosis Date  . Diabetes mellitus without complication ([place])   . Hypertension      History reviewed. No pertinent family history.   Current Outpatient Medications:  .  aspirin EC 81 MG tablet, Take 81 mg by mouth daily., Disp: , Rfl:  .  atorvastatin (LIPITOR) 40 MG tablet, TAKE 1 TABLET EVERY EVENING, Disp: 90 tablet, Rfl: 3 .  B-D UF III MINI PEN NEEDLES 31G X 5 MM MISC, USE AS DIRECTED, Disp: 100 each, Rfl: 3 .  clindamycin (CLEOCIN T) 1 % lotion, Apply  as directed to affected area twice a day, Disp: , Rfl:  .  doxycycline (VIBRAMYCIN) 50 MG capsule, Take 50 mg by mouth 2 (two) times daily., Disp: , Rfl:  .  glucose blood (FREESTYLE LITE) test strip, Use as instructed, Disp: 300 each, Rfl: 4 .  HUMALOG KWIKPEN 100 UNIT/ML KwikPen, INJECT UNDER THE SKIN BEFORE MEALS AS PER INSULIN SLIDING SCALE PROTOCOL, Disp: 45 mL, Rfl: 3 .  insulin glargine (LANTUS) 100 UNIT/ML injection, Inject 0.48 mLs (48 Units total) into the skin at bedtime., Disp: 10 mL, Rfl: 1 .  Insulin Lispro, Human, (HUMALOG [place]), Inject 15 units of lipase into the skin See admin instructions. Per sliding scale, Disp: , Rfl:  .  JANUMET  50-1000 MG tablet, TAKE 1 TABLET TWICE A DAY, Disp: 180 tablet, Rfl: 3 .  temazepam (RESTORIL) 15 MG capsule, TAKE ONE CAPSULE BY MOUTH AT BEDTIME AS NEEDED FOR SLEEP, Disp: 30 capsule, Rfl: 4 .  TRULICITY 1.5 WK/4.6KM SOPN, INJECT 1.5 MG INTO THE SKIN ONCE A WEEK, Disp: 6 mL, Rfl: 3 .  valsartan (DIOVAN) 160 MG tablet, TAKE 1 TABLET DAILY, Disp: 90 tablet, Rfl: 3   Allergies  Allergen Reactions  . Metformin And Related Diarrhea     Review of Systems  Constitutional: Negative.  Negative for fatigue.  Eyes: Negative for blurred  vision.  Respiratory: Negative.  Negative for cough.   Cardiovascular: Negative for chest pain, palpitations and leg swelling.  Musculoskeletal: Negative.   Neurological: Negative.  Negative for dizziness and headaches.  Psychiatric/Behavioral: Negative.      Today's Vitals   05/07/20 0841  BP: (!) 142/90  Pulse: 72  Temp: 98 F (36.7 C)  TempSrc: Oral  Weight: 254 lb (115.2 kg)  Height: 5' 10.6" (1.793 m)  PainSc: 0-No pain   Body mass index is 35.83 kg/m.   Objective:  Physical Exam Vitals reviewed.  Constitutional:      General: He is not in acute distress.    Appearance: Normal appearance. He is obese.  Cardiovascular:     Rate and Rhythm: Normal rate and regular rhythm.     Pulses: Normal pulses.     Heart sounds: Normal heart sounds. No murmur.  Pulmonary:     Effort: Pulmonary effort is normal. No respiratory distress.     Breath sounds: Normal breath sounds.  Skin:    General: Skin is warm and dry.  Neurological:     General: No focal deficit present.     Mental Status: He is alert and oriented to person, place, and time.     Cranial Nerves: No cranial nerve deficit.  Psychiatric:        Mood and Affect: Mood normal.        Behavior: Behavior normal.        Thought Content: Thought content normal.        Judgment: Judgment normal.         Assessment And Plan:     1. Type 2 diabetes mellitus with other specified complication, with long-term current use of insulin (HCC) Chronic, continue with current medications Will check HgbA1c Continued encouragement to increase his physical activity - CMP14+EGFR - Hemoglobin A1c  2. Mixed hyperlipidemia Chronic, well controlled except for his good cholesterol is below 39 at 25  - CMP14+EGFR - Lipid panel  3. Essential hypertension Chronic, slightly elevated today he is drinking coffee while in the office. I will not make any changes to his medications at this time He does continue to smoke and has a goal to  quit by June 1st.  - CMP14+EGFR   Does not need any refills on his medications at this time.    [name], FNP    THE PATIENT IS ENCOURAGED TO PRACTICE SOCIAL DISTANCING DUE TO THE COVID-19 PANDEMIC.

## 2020-05-07 NOTE — Patient Instructions (Signed)
Increase good fat intake to include salmon (or fish oil supplement), avocadol, nuts and olive oil.  Also increase physical activity as well.

## 2020-05-08 LAB — LIPID PANEL
Chol/HDL Ratio: 4 ratio (ref 0.0–5.0)
Cholesterol, Total: 104 mg/dL (ref 100–199)
HDL: 26 mg/dL — ABNORMAL LOW (ref >39)
LDL Chol Calc (NIH): 57 mg/dL (ref 0–99)
Triglycerides: 114 mg/dL (ref 0–149)
VLDL Cholesterol Cal: 21 mg/dL (ref 5–40)

## 2020-05-08 LAB — CMP14+EGFR
ALT: 18 IU/L (ref 0–44)
AST: 17 IU/L (ref 0–40)
Albumin/Globulin Ratio: 1.9 (ref 1.2–2.2)
Albumin: 4.3 g/dL (ref 3.8–4.9)
Alkaline Phosphatase: 87 IU/L (ref 48–121)
BUN/Creatinine Ratio: 11 (ref 9–20)
BUN: 10 mg/dL (ref 6–24)
Bilirubin Total: 0.3 mg/dL (ref 0.0–1.2)
CO2: 22 mmol/L (ref 20–29)
Calcium: 9 mg/dL (ref 8.7–10.2)
Chloride: 105 mmol/L (ref 96–106)
Creatinine, Ser: 0.95 mg/dL (ref 0.76–1.27)
GFR calc Af Amer: 101 mL/min/{1.73_m2} (ref >59)
GFR calc non Af Amer: 87 mL/min/{1.73_m2} (ref >59)
Globulin, Total: 2.3 g/dL (ref 1.5–4.5)
Glucose: 127 mg/dL — ABNORMAL HIGH (ref 65–99)
Potassium: 4.5 mmol/L (ref 3.5–5.2)
Sodium: 140 mmol/L (ref 134–144)
Total Protein: 6.6 g/dL (ref 6.0–8.5)

## 2020-05-08 LAB — HEMOGLOBIN A1C
Est. average glucose Bld gHb Est-mCnc: 146 mg/dL
Hgb A1c MFr Bld: 6.7 % — ABNORMAL HIGH (ref 4.8–5.6)

## 2020-05-28 ENCOUNTER — Other Ambulatory Visit: Payer: Self-pay

## 2020-05-28 ENCOUNTER — Ambulatory Visit (INDEPENDENT_AMBULATORY_CARE_PROVIDER_SITE_OTHER): Admitting: Podiatry

## 2020-05-28 ENCOUNTER — Encounter: Payer: Self-pay | Admitting: Podiatry

## 2020-05-28 DIAGNOSIS — L84 Corns and callosities: Secondary | ICD-10-CM | POA: Diagnosis not present

## 2020-05-28 DIAGNOSIS — E1165 Type 2 diabetes mellitus with hyperglycemia: Secondary | ICD-10-CM | POA: Diagnosis not present

## 2020-05-28 DIAGNOSIS — Z794 Long term (current) use of insulin: Secondary | ICD-10-CM

## 2020-05-28 DIAGNOSIS — M79674 Pain in right toe(s): Secondary | ICD-10-CM | POA: Diagnosis not present

## 2020-05-28 DIAGNOSIS — B351 Tinea unguium: Secondary | ICD-10-CM | POA: Diagnosis not present

## 2020-05-28 DIAGNOSIS — M79675 Pain in left toe(s): Secondary | ICD-10-CM | POA: Diagnosis not present

## 2020-05-28 NOTE — Patient Instructions (Signed)
Diabetes Mellitus and Foot Care Foot care is an important part of your health, especially when you have diabetes. Diabetes may cause you to have problems because of poor blood flow (circulation) to your feet and legs, which can cause your skin to:  Become thinner and drier.  Break more easily.  Heal more slowly.  Peel and crack. You may also have nerve damage (neuropathy) in your legs and feet, causing decreased feeling in them. This means that you may not notice minor injuries to your feet that could lead to more serious problems. Noticing and addressing any potential problems early is the best way to prevent future foot problems. How to care for your feet Foot hygiene  Wash your feet daily with warm water and mild soap. Do not use hot water. Then, pat your feet and the areas between your toes until they are completely dry. Do not soak your feet as this can dry your skin.  Trim your toenails straight across. Do not dig under them or around the cuticle. File the edges of your nails with an emery board or nail file.  Apply a moisturizing lotion or petroleum jelly to the skin on your feet and to dry, brittle toenails. Use lotion that does not contain alcohol and is unscented. Do not apply lotion between your toes. Shoes and socks  Wear clean socks or stockings every day. Make sure they are not too tight. Do not wear knee-high stockings since they may decrease blood flow to your legs.  Wear shoes that fit properly and have enough cushioning. Always look in your shoes before you put them on to be sure there are no objects inside.  To break in new shoes, wear them for just a few hours a day. This prevents injuries on your feet. Wounds, scrapes, corns, and calluses  Check your feet daily for blisters, cuts, bruises, sores, and redness. If you cannot see the bottom of your feet, use a mirror or ask someone for help.  Do not cut corns or calluses or try to remove them with medicine.  If you  find a minor scrape, cut, or break in the skin on your feet, keep it and the skin around it clean and dry. You may clean these areas with mild soap and water. Do not clean the area with peroxide, alcohol, or iodine.  If you have a wound, scrape, corn, or callus on your foot, look at it several times a day to make sure it is healing and not infected. Check for: ? Redness, swelling, or pain. ? Fluid or blood. ? Warmth. ? Pus or a bad smell. General instructions  Do not cross your legs. This may decrease blood flow to your feet.  Do not use heating pads or hot water bottles on your feet. They may burn your skin. If you have lost feeling in your feet or legs, you may not know this is happening until it is too late.  Protect your feet from hot and cold by wearing shoes, such as at the beach or on hot pavement.  Schedule a complete foot exam at least once a year (annually) or more often if you have foot problems. If you have foot problems, report any cuts, sores, or bruises to your health care provider immediately. Contact a health care provider if:  You have a medical condition that increases your risk of infection and you have any cuts, sores, or bruises on your feet.  You have an injury that is not   healing.  You have redness on your legs or feet.  You feel burning or tingling in your legs or feet.  You have pain or cramps in your legs and feet.  Your legs or feet are numb.  Your feet always feel cold.  You have pain around a toenail. Get help right away if:  You have a wound, scrape, corn, or callus on your foot and: ? You have pain, swelling, or redness that gets worse. ? You have fluid or blood coming from the wound, scrape, corn, or callus. ? Your wound, scrape, corn, or callus feels warm to the touch. ? You have pus or a bad smell coming from the wound, scrape, corn, or callus. ? You have a fever. ? You have a red line going up your leg. Summary  Check your feet every day  for cuts, sores, red spots, swelling, and blisters.  Moisturize feet and legs daily.  Wear shoes that fit properly and have enough cushioning.  If you have foot problems, report any cuts, sores, or bruises to your health care provider immediately.  Schedule a complete foot exam at least once a year (annually) or more often if you have foot problems. This information is not intended to replace advice given to you by your health care provider. Make sure you discuss any questions you have with your health care provider. Document Revised: 08/30/2019 Document Reviewed: 01/08/2017 Elsevier Patient Education  2020 Elsevier Inc.  

## 2020-05-31 NOTE — Progress Notes (Signed)
Subjective: Stephen Sampson presents today preventative diabetic foot care and painful callus(es) b/l and painful mycotic toenails b/l that are difficult to trim. Pain interferes with ambulation. Aggravating factors include wearing enclosed shoe gear. Pain is relieved with periodic professional debridement.  [name], FNP is patient's PCP. Last visit was: 05/07/2020.  He voices no new pedal concerns on today's visit. [name]  Past Medical History:  Diagnosis Date  . Diabetes mellitus without complication ([place])   . Hypertension      Current Outpatient Medications on File Prior to Visit  Medication Sig Dispense Refill  . aspirin EC 81 MG tablet Take 81 mg by mouth daily.    [name] atorvastatin (LIPITOR) 40 MG tablet TAKE 1 TABLET EVERY EVENING 90 tablet 3  . B-D UF III MINI PEN NEEDLES 31G X 5 MM MISC USE AS DIRECTED 100 each 3  . clindamycin (CLEOCIN T) 1 % lotion Apply  as directed to affected area twice a day    . doxycycline (VIBRAMYCIN) 50 MG capsule Take 50 mg by mouth 2 (two) times daily.    [name] glucose blood (FREESTYLE LITE) test strip Use as instructed 300 each 4  . HUMALOG KWIKPEN 100 UNIT/ML KwikPen INJECT UNDER THE SKIN BEFORE MEALS AS PER INSULIN SLIDING SCALE PROTOCOL 45 mL 3  . insulin glargine (LANTUS) 100 UNIT/ML injection Inject 0.48 mLs (48 Units total) into the skin at bedtime. 10 mL 1  . Insulin Lispro, Human, (HUMALOG [place]) Inject 15 units of lipase into the skin See admin instructions. Per sliding scale    . JANUMET 50-1000 MG tablet TAKE 1 TABLET TWICE A DAY 180 tablet 3  . temazepam (RESTORIL) 15 MG capsule TAKE ONE CAPSULE BY MOUTH AT BEDTIME AS NEEDED FOR SLEEP 30 capsule 4  . TRULICITY 1.5 PZ/0.2HE SOPN INJECT 1.5 MG INTO THE SKIN ONCE A WEEK 6 mL 3  . valsartan (DIOVAN) 160 MG tablet TAKE 1 TABLET DAILY 90 tablet 3   No current facility-administered medications on file prior to visit.     Allergies  Allergen Reactions  . Metformin And Related Diarrhea     Objective: Stephen Sampson is a pleasant 59 y.o. y.o. Patient Race: White or Caucasian [1]  male in NAD. AAO x 3.  There were no vitals filed for this visit.  Vascular Examination: Neurovascular status unchanged b/l lower extremities. Capillary refill time to digits immediate b/l. Palpable DP pulses b/l. Palpable PT pulses b/l. Pedal hair present b/l. Skin temperature gradient within normal limits b/l. No pain with calf compression b/l.  Dermatological Examination: Pedal skin with normal turgor, texture and tone bilaterally. No open wounds bilaterally. Toenails 1-5 b/l elongated, discolored, dystrophic, thickened, crumbly with subungual debris and tenderness to dorsal palpation. Hyperkeratotic lesion(s) submet head 5 left foot and submet head 5 right foot.  No erythema, no edema, no drainage, no flocculence.  Musculoskeletal: Normal muscle strength 5/5 to all lower extremity muscle groups bilaterally. No pain crepitus or joint limitation noted with ROM b/l. Hammertoes noted to the b/l lower extremities. Patient ambulates independent of any assistive aids.  Neurological Examination: Protective sensation intact 5/5 intact bilaterally with 10g monofilament b/l. Vibratory sensation intact b/l. Proprioception intact bilaterally.  Assessment: 1. Pain due to onychomycosis of toenails of both feet   2. Callus   3. Controlled type 2 diabetes mellitus with hyperglycemia, with long-term current use of insulin ([place])   Plan: -Examined patient. -No new findings. No new orders. -Continue diabetic foot care principles. -Toenails 1-5 b/l were  debrided in length and girth with sterile nail nippers and dremel without iatrogenic bleeding.  -Callus(es) submet head 5 left foot and submet head 5 right foot pared utilizing sterile scalpel blade without complication or incident. Total number debrided =2. -Patient to report any pedal injuries to medical professional immediately. -Patient to continue  soft, supportive shoe gear daily. -Patient/POA to call should there be question/concern in the interim.  Return in about 3 months (around 08/28/2020) for diabetic nail and callus trim.  Marzetta Board, DPM

## 2020-08-02 ENCOUNTER — Other Ambulatory Visit: Payer: Self-pay | Admitting: Nurse Practitioner

## 2020-08-05 ENCOUNTER — Encounter: Payer: Self-pay | Admitting: Nurse Practitioner

## 2020-08-05 ENCOUNTER — Ambulatory Visit (INDEPENDENT_AMBULATORY_CARE_PROVIDER_SITE_OTHER): Admitting: Nurse Practitioner

## 2020-08-05 ENCOUNTER — Other Ambulatory Visit: Payer: Self-pay

## 2020-08-05 VITALS — BP 160/90 | HR 72 | Temp 97.6°F | Ht 70.6 in | Wt 258.2 lb

## 2020-08-05 DIAGNOSIS — Z125 Encounter for screening for malignant neoplasm of prostate: Secondary | ICD-10-CM | POA: Diagnosis not present

## 2020-08-05 DIAGNOSIS — Z0000 Encounter for general adult medical examination without abnormal findings: Secondary | ICD-10-CM | POA: Diagnosis not present

## 2020-08-05 DIAGNOSIS — Z716 Tobacco abuse counseling: Secondary | ICD-10-CM

## 2020-08-05 DIAGNOSIS — I10 Essential (primary) hypertension: Secondary | ICD-10-CM | POA: Diagnosis not present

## 2020-08-05 DIAGNOSIS — Z1159 Encounter for screening for other viral diseases: Secondary | ICD-10-CM

## 2020-08-05 DIAGNOSIS — E559 Vitamin D deficiency, unspecified: Secondary | ICD-10-CM

## 2020-08-05 DIAGNOSIS — E782 Mixed hyperlipidemia: Secondary | ICD-10-CM

## 2020-08-05 DIAGNOSIS — L732 Hidradenitis suppurativa: Secondary | ICD-10-CM

## 2020-08-05 DIAGNOSIS — E1165 Type 2 diabetes mellitus with hyperglycemia: Secondary | ICD-10-CM

## 2020-08-05 DIAGNOSIS — Z794 Long term (current) use of insulin: Secondary | ICD-10-CM | POA: Diagnosis not present

## 2020-08-05 LAB — POCT UA - MICROALBUMIN
Albumin/Creatinine Ratio, Urine, POC: 30
Creatinine, POC: 200 mg/dL
Microalbumin Ur, POC: 30 mg/L

## 2020-08-05 LAB — POCT URINALYSIS DIPSTICK
Bilirubin, UA: NEGATIVE
Blood, UA: NEGATIVE
Glucose, UA: NEGATIVE
Ketones, UA: NEGATIVE
Leukocytes, UA: NEGATIVE
Nitrite, UA: NEGATIVE
Protein, UA: NEGATIVE
Spec Grav, UA: 1.02 (ref 1.010–1.025)
Urobilinogen, UA: 0.2 E.U./dL
pH, UA: 5.5 (ref 5.0–8.0)

## 2020-08-05 NOTE — Patient Instructions (Signed)

## 2020-08-05 NOTE — Progress Notes (Signed)
I,Yamilka Roman [employer] as a Education administrator for [employer], FNP.,have documented all relevant documentation on the behalf of [name], FNP,as directed by  [name], FNP while in the presence of [name], [place]. This visit occurred during the SARS-CoV-2 public health emergency.  Safety protocols were in place, including screening questions prior to the visit, additional usage of staff PPE, and extensive cleaning of exam room while observing appropriate contact time as indicated for disinfecting solutions.  Subjective:     Patient ID: Stephen Sampson , male    DOB: Feb 14, 1961 , 59 y.o.   MRN: [id number]   Chief Complaint  Patient presents with  . Annual Exam    HPI  Here for HM.   States "he recognizes the trend with his diabetes and trying to correct the behavior" Continues to have follow up with Podiatry and the Dermatology - diagnosed with hydradenititis supprat  Diabetes He presents for his follow-up diabetic visit. He has type 2 diabetes mellitus. His disease course has been improving. Pertinent negatives for hypoglycemia include no dizziness or headaches. Pertinent negatives for diabetes include no blurred vision, no chest pain and no fatigue. There are no hypoglycemic complications. Symptoms are stable. There are no diabetic complications. Risk factors for coronary artery disease include obesity, sedentary lifestyle, male sex, hypertension, dyslipidemia and diabetes mellitus. Current diabetic treatment includes oral agent (triple therapy). He is compliant with treatment most of the time (only takes Janumet once a day due to his routine). His weight is decreasing steadily. He is following a generally healthy (trying to watch portions) diet. When asked about meal planning, he reported none. He has not had a previous visit with a dietitian. He participates in exercise intermittently. His overall blood glucose range is 110-130 mg/dl. (Reports his blood sugar is running higher  feels is related to "snacking".  Blood sugar as high as 175-200 seldom will be higher than that.  Lowest blood sugar is 95  ) An ACE inhibitor/angiotensin II receptor blocker is being taken. He does not see a podiatrist.Eye exam is current.  Hypertension This is a chronic problem. The current episode started more than 1 year ago. The problem is uncontrolled. Pertinent negatives include no anxiety, blurred vision, chest pain, headaches or palpitations. There are no known risk factors for coronary artery disease. Past treatments include angiotensin blockers. There are no compliance problems.  There is no history of angina. There is no history of chronic renal disease.     Past Medical History:  Diagnosis Date  . Diabetes mellitus without complication ([place])   . Hypertension      History reviewed. No pertinent family history.   Current Outpatient Medications:  .  aspirin EC 81 MG tablet, Take 81 mg by mouth daily., Disp: , Rfl:  .  atorvastatin (LIPITOR) 40 MG tablet, TAKE 1 TABLET EVERY EVENING, Disp: 90 tablet, Rfl: 3 .  B-D UF III MINI PEN NEEDLES 31G X 5 MM MISC, USE AS DIRECTED, Disp: 100 each, Rfl: 3 .  clindamycin (CLEOCIN T) 1 % lotion, Apply  as directed to affected area twice a day, Disp: , Rfl:  .  doxycycline (VIBRAMYCIN) 50 MG capsule, Take 50 mg by mouth 2 (two) times daily., Disp: , Rfl:  .  glucose blood (FREESTYLE LITE) test strip, Use as instructed, Disp: 300 each, Rfl: 4 .  HUMALOG KWIKPEN 100 UNIT/ML KwikPen, INJECT UNDER THE SKIN BEFORE MEALS AS PER INSULIN SLIDING SCALE PROTOCOL, Disp: 45 mL, Rfl: 3 .  insulin glargine (LANTUS)  100 UNIT/ML injection, Inject 0.48 mLs (48 Units total) into the skin at bedtime., Disp: 10 mL, Rfl: 1 .  JANUMET 50-1000 MG tablet, TAKE 1 TABLET TWICE A DAY, Disp: 180 tablet, Rfl: 3 .  temazepam (RESTORIL) 15 MG capsule, TAKE ONE CAPSULE BY MOUTH AT BEDTIME AS NEEDED FOR SLEEP, Disp: 30 capsule, Rfl: 4 .  TRULICITY 1.5 BD/5.3GD SOPN, INJECT 1.5 MG  INTO THE SKIN ONCE A WEEK, Disp: 6 mL, Rfl: 3 .  valsartan (DIOVAN) 160 MG tablet, TAKE 1 TABLET DAILY, Disp: 90 tablet, Rfl: 3 .  LANTUS SOLOSTAR 100 UNIT/ML Solostar Pen, INJECT 50 UNITS UNDER THE SKIN AT BEDTIME, Disp: 135 mL, Rfl: 3   Allergies  Allergen Reactions  . Metformin And Related Diarrhea     Men's preventive visit. Patient Health Questionnaire (PHQ-2) is    Office Visit from 02/06/2020 in [facility]  PHQ-2 Total Score 0     Patient is on a regular diet, feels like he is grazing more than he should. Trying to keep food out of his office. Trying to be more conscious about his eating.  He is doing regular yard work. Marital status: Married.    Relevant history for alcohol use is:  Social History   Substance and Sexual Activity  Alcohol Use Yes   Comment: occ   . Relevant history for tobacco use is:  Social History   Tobacco Use  Smoking Status Current Every Day Smoker  . Packs/day: 1.00  . Types: Cigarettes  Smokeless Tobacco Never Used  Tobacco Comment   he has not quit smoking at this time.   .   Review of Systems  Constitutional: Negative.  Negative for fatigue.  HENT: Negative.   Eyes: Negative.  Negative for blurred vision.  Respiratory: Negative.   Cardiovascular: Negative.  Negative for chest pain, palpitations and leg swelling.  Gastrointestinal: Negative.   Musculoskeletal: Negative.   Skin: Negative.   Neurological: Negative for dizziness and headaches.  Psychiatric/Behavioral: Negative.      Today's Vitals   08/05/20 0842  BP: (!) 160/90  Pulse: 72  Temp: 97.6 F (36.4 C)  TempSrc: Oral  Weight: 258 lb 3.2 oz (117.1 kg)  Height: 5' 10.6" (1.793 m)  PainSc: 0-No pain   Body mass index is 36.42 kg/m.   Wt Readings from Last 3 Encounters:  08/05/20 258 lb 3.2 oz (117.1 kg)  05/07/20 254 lb (115.2 kg)  02/06/20 251 lb (113.9 kg)    Objective:  Physical Exam Vitals reviewed.  Constitutional:       Appearance: Normal appearance. He is obese.  HENT:     Head: Normocephalic and atraumatic.     Right Ear: Tympanic membrane, ear canal and external ear normal. There is no impacted cerumen.     Left Ear: Tympanic membrane, ear canal and external ear normal. There is no impacted cerumen.     Nose:     Comments: Deferred - masked    Mouth/Throat:     Comments: Deferred - masked Eyes:     Extraocular Movements: Extraocular movements intact.     Conjunctiva/sclera: Conjunctivae normal.     Pupils: Pupils are equal, round, and reactive to light.  Cardiovascular:     Rate and Rhythm: Normal rate and regular rhythm.     Pulses: Normal pulses.     Heart sounds: Normal heart sounds. No murmur heard.   Pulmonary:     Effort: Pulmonary effort is normal. No respiratory distress.  Breath sounds: Normal breath sounds.  Abdominal:     General: Abdomen is flat. Bowel sounds are normal. There is no distension.     Palpations: Abdomen is soft.  Genitourinary:    Prostate: Normal.     Rectum: Guaiac result negative.  Musculoskeletal:        General: Normal range of motion.     Cervical back: Normal range of motion and neck supple. No tenderness.  Skin:    General: Skin is warm.     Capillary Refill: Capillary refill takes less than 2 seconds.  Neurological:     General: No focal deficit present.     Mental Status: He is alert and oriented to person, place, and time.  Psychiatric:        Mood and Affect: Mood normal.        Behavior: Behavior normal.        Thought Content: Thought content normal.        Judgment: Judgment normal.         Assessment And Plan:    1. Encounter for health maintenance examination . Behavior modifications discussed and diet history reviewed.   . Pt will continue to exercise regularly and modify diet with low GI, plant based foods and decrease intake of processed foods.  . Recommend intake of daily multivitamin, Vitamin D, and calcium.  . Recommend for  preventive screenings, as well as recommend immunizations that include influenza (will call when available), TDAP (up to date)   2. Encounter for tobacco use cessation counseling  He does not feel chantix was effective but understands it is in his best interest to quit smoking  3. Encounter for prostate cancer screening  No abnormal findings on manual exam - PSA  4. Encounter for hepatitis C screening test for low risk patient  Will check Hepatitis C screening due to recent recommendations to screen all adults 18 years and older - Hepatitis C antibody  5. Controlled type 2 diabetes mellitus with hyperglycemia, with long-term current use of insulin (HCC)  Chronic, fairly controlled  Will check HgbA1c  Discussed the importance of limiting his intake of sugary and high starch foods.  Encouraged to exercise regularly - CBC - Hemoglobin A1c - POCT Urinalysis Dipstick (81002) - POCT UA - Microalbumin - EKG 12-Lead  6. Essential hypertension . B/P is elevated today he says he takes his medications at night, no changes made at this visit.  . CMP ordered to check renal function.  . The importance of regular exercise and dietary modification was stressed to the patient.  . EKG done with NSR - CMP14+EGFR - EKG 12-Lead  7. Mixed hyperlipidemia  Chronic, stable  Continue with current medications - CMP14+EGFR - Lipid panel  8. Vitamin D deficiency  Will check vitamin D level and supplement as needed.     Also encouraged to spend 15 minutes in the sun daily.  - VITAMIN D 25 Hydroxy (Vit-D Deficiency, Fractures)  9. Hidradenitis suppurativa  Continue follow up with dermatology    Patient was given opportunity to ask questions. Patient verbalized understanding of the plan and was able to repeat key elements of the plan. All questions were answered to their satisfaction.   [name], FNP   I, [name], FNP, have reviewed all documentation for this visit. The  documentation on 08/05/20 for the exam, diagnosis, procedures, and orders are all accurate and complete.  THE PATIENT IS ENCOURAGED TO PRACTICE SOCIAL DISTANCING DUE TO THE COVID-19 PANDEMIC.

## 2020-08-06 LAB — CMP14+EGFR
ALT: 15 IU/L (ref 0–44)
AST: 11 IU/L (ref 0–40)
Albumin/Globulin Ratio: 1.6 (ref 1.2–2.2)
Albumin: 4.3 g/dL (ref 3.8–4.9)
Alkaline Phosphatase: 82 IU/L (ref 48–121)
BUN/Creatinine Ratio: 11 (ref 9–20)
BUN: 11 mg/dL (ref 6–24)
Bilirubin Total: 0.4 mg/dL (ref 0.0–1.2)
CO2: 22 mmol/L (ref 20–29)
Calcium: 8.8 mg/dL (ref 8.7–10.2)
Chloride: 105 mmol/L (ref 96–106)
Creatinine, Ser: 0.98 mg/dL (ref 0.76–1.27)
GFR calc Af Amer: 97 mL/min/{1.73_m2} (ref >59)
GFR calc non Af Amer: 84 mL/min/{1.73_m2} (ref >59)
Globulin, Total: 2.7 g/dL (ref 1.5–4.5)
Glucose: 148 mg/dL — ABNORMAL HIGH (ref 65–99)
Potassium: 4.2 mmol/L (ref 3.5–5.2)
Sodium: 142 mmol/L (ref 134–144)
Total Protein: 7 g/dL (ref 6.0–8.5)

## 2020-08-06 LAB — CBC
Hematocrit: 43.1 % (ref 37.5–51.0)
Hemoglobin: 14.7 g/dL (ref 13.0–17.7)
MCH: 29.2 pg (ref 26.6–33.0)
MCHC: 34.1 g/dL (ref 31.5–35.7)
MCV: 86 fL (ref 79–97)
Platelets: 206 10*3/uL (ref 150–450)
RBC: 5.03 x10E6/uL (ref 4.14–5.80)
RDW: 13.3 % (ref 11.6–15.4)
WBC: 6.9 10*3/uL (ref 3.4–10.8)

## 2020-08-06 LAB — HEPATITIS C ANTIBODY: Hep C Virus Ab: <0.1 s/co ratio (ref 0.0–0.9)

## 2020-08-06 LAB — HEMOGLOBIN A1C
Est. average glucose Bld gHb Est-mCnc: 160 mg/dL
Hgb A1c MFr Bld: 7.2 % — ABNORMAL HIGH (ref 4.8–5.6)

## 2020-08-06 LAB — PSA: Prostate Specific Ag, Serum: 0.3 ng/mL (ref 0.0–4.0)

## 2020-08-06 LAB — LIPID PANEL
Chol/HDL Ratio: 3.9 ratio (ref 0.0–5.0)
Cholesterol, Total: 105 mg/dL (ref 100–199)
HDL: 27 mg/dL — ABNORMAL LOW (ref >39)
LDL Chol Calc (NIH): 59 mg/dL (ref 0–99)
Triglycerides: 101 mg/dL (ref 0–149)
VLDL Cholesterol Cal: 19 mg/dL (ref 5–40)

## 2020-08-06 LAB — VITAMIN D 25 HYDROXY (VIT D DEFICIENCY, FRACTURES): Vit D, 25-Hydroxy: 17.5 ng/mL — ABNORMAL LOW (ref 30.0–100.0)

## 2020-08-13 ENCOUNTER — Other Ambulatory Visit: Payer: Self-pay | Admitting: Nurse Practitioner

## 2020-08-13 DIAGNOSIS — E559 Vitamin D deficiency, unspecified: Secondary | ICD-10-CM

## 2020-08-13 MED ORDER — VITAMIN D (ERGOCALCIFEROL) 1.25 MG (50000 UNIT) PO CAPS: 50000.0000 [IU] | ORAL_CAPSULE | ORAL | 1 refills | Status: DC

## 2020-08-16 ENCOUNTER — Encounter: Payer: Self-pay | Admitting: Nurse Practitioner

## 2020-09-10 ENCOUNTER — Other Ambulatory Visit: Payer: Self-pay

## 2020-09-10 ENCOUNTER — Encounter: Payer: Self-pay | Admitting: Podiatry

## 2020-09-10 ENCOUNTER — Ambulatory Visit (INDEPENDENT_AMBULATORY_CARE_PROVIDER_SITE_OTHER): Admitting: Podiatry

## 2020-09-10 DIAGNOSIS — B351 Tinea unguium: Secondary | ICD-10-CM

## 2020-09-10 DIAGNOSIS — E1165 Type 2 diabetes mellitus with hyperglycemia: Secondary | ICD-10-CM | POA: Diagnosis not present

## 2020-09-10 DIAGNOSIS — Z794 Long term (current) use of insulin: Secondary | ICD-10-CM

## 2020-09-10 DIAGNOSIS — M79675 Pain in left toe(s): Secondary | ICD-10-CM

## 2020-09-10 DIAGNOSIS — M79674 Pain in right toe(s): Secondary | ICD-10-CM

## 2020-09-10 NOTE — Progress Notes (Signed)
Subjective: Stephen Sampson presents today preventative diabetic foot care and painful callus(es) b/l and painful mycotic toenails b/l that are difficult to trim. Pain interferes with ambulation. Aggravating factors include wearing enclosed shoe gear. Pain is relieved with periodic professional debridement.  [name], FNP is patient's PCP. Last visit was: 08/05/2020.  He voices no new pedal concerns on today's visit. [name]  Past Medical History:  Diagnosis Date  . Diabetes mellitus without complication ([place])   . Hypertension      Current Outpatient Medications on File Prior to Visit  Medication Sig Dispense Refill  . aspirin EC 81 MG tablet Take 81 mg by mouth daily.    [name] atorvastatin (LIPITOR) 40 MG tablet TAKE 1 TABLET EVERY EVENING 90 tablet 3  . B-D UF III MINI PEN NEEDLES 31G X 5 MM MISC USE AS DIRECTED 100 each 3  . clindamycin (CLEOCIN T) 1 % lotion Apply  as directed to affected area twice a day    . doxycycline (VIBRAMYCIN) 50 MG capsule Take 50 mg by mouth 2 (two) times daily.    [name] glucose blood (FREESTYLE LITE) test strip Use as instructed 300 each 4  . HUMALOG KWIKPEN 100 UNIT/ML KwikPen INJECT UNDER THE SKIN BEFORE MEALS AS PER INSULIN SLIDING SCALE PROTOCOL 45 mL 3  . insulin glargine (LANTUS) 100 UNIT/ML injection Inject 0.48 mLs (48 Units total) into the skin at bedtime. 10 mL 1  . JANUMET 50-1000 MG tablet TAKE 1 TABLET TWICE A DAY 180 tablet 3  . LANTUS SOLOSTAR 100 UNIT/ML Solostar Pen INJECT 50 UNITS UNDER THE SKIN AT BEDTIME 135 mL 3  . temazepam (RESTORIL) 15 MG capsule TAKE ONE CAPSULE BY MOUTH AT BEDTIME AS NEEDED FOR SLEEP 30 capsule 4  . TRULICITY 1.5 FT/7.3UK SOPN INJECT 1.5 MG INTO THE SKIN ONCE A WEEK 6 mL 3  . valsartan (DIOVAN) 160 MG tablet TAKE 1 TABLET DAILY 90 tablet 3  . Vitamin D, Ergocalciferol, (DRISDOL) 1.25 MG (50000 UNIT) CAPS capsule Take 1 capsule (50,000 Units total) by mouth 2 (two) times a week. 24 capsule 1   No current  facility-administered medications on file prior to visit.     Allergies  Allergen Reactions  . Metformin And Related Diarrhea    Objective: Chirstopher Sampson is a pleasant 59 y.o. y.o. Patient Race: White or Caucasian [1]  male in NAD. AAO x 3.  There were no vitals filed for this visit.  Vascular Examination: Neurovascular status unchanged b/l lower extremities. Capillary refill time to digits immediate b/l. Palpable DP pulses b/l. Palpable PT pulses b/l. Pedal hair present b/l. Skin temperature gradient within normal limits b/l. No pain with calf compression b/l.  Dermatological Examination: Pedal skin with normal turgor, texture and tone bilaterally. No open wounds bilaterally. Toenails 1-5 b/l elongated, discolored, dystrophic, thickened, crumbly with subungual debris and tenderness to dorsal palpation. No hyperkeratotic nor porokeratotic lesions present on today's visit.  Musculoskeletal: Normal muscle strength 5/5 to all lower extremity muscle groups bilaterally. No pain crepitus or joint limitation noted with ROM b/l. Hammertoes noted to the b/l lower extremities. Patient ambulates independent of any assistive aids.  Neurological Examination: Protective sensation intact 5/5 intact bilaterally with 10g monofilament b/l. Vibratory sensation intact b/l. Proprioception intact bilaterally.  Assessment: 1. Pain due to onychomycosis of toenails of both feet   2. Controlled type 2 diabetes mellitus with hyperglycemia, with long-term current use of insulin ([place])     Plan: -Examined patient. -No new findings. No new orders. -  Continue diabetic foot care principles. -Toenails 1-5 b/l were debrided in length and girth with sterile nail nippers and dremel without iatrogenic bleeding.  -Patient to report any pedal injuries to medical professional immediately. -Patient to continue soft, supportive shoe gear daily. -Patient/POA to call should there be question/concern in the  interim.  Return in about 6 months (around 03/10/2021) for diabetic foot care.  Marzetta Board, DPM

## 2020-09-12 ENCOUNTER — Encounter: Payer: Self-pay | Admitting: Nurse Practitioner

## 2020-09-12 ENCOUNTER — Other Ambulatory Visit: Payer: Self-pay | Admitting: Nurse Practitioner

## 2020-09-16 ENCOUNTER — Encounter: Payer: Self-pay | Admitting: Nurse Practitioner

## 2020-10-05 ENCOUNTER — Other Ambulatory Visit: Payer: Self-pay | Admitting: Nurse Practitioner

## 2020-10-05 DIAGNOSIS — G4700 Insomnia, unspecified: Secondary | ICD-10-CM

## 2020-10-07 NOTE — Telephone Encounter (Signed)
Please refill patient's prescription 

## 2020-11-05 ENCOUNTER — Encounter: Payer: Self-pay | Admitting: Nurse Practitioner

## 2020-11-05 ENCOUNTER — Other Ambulatory Visit: Payer: Self-pay

## 2020-11-05 ENCOUNTER — Ambulatory Visit: Admitting: Nurse Practitioner

## 2020-11-05 VITALS — BP 132/80 | HR 76 | Temp 98.2°F | Ht 70.6 in | Wt 254.6 lb

## 2020-11-05 DIAGNOSIS — E1169 Type 2 diabetes mellitus with other specified complication: Secondary | ICD-10-CM | POA: Diagnosis not present

## 2020-11-05 DIAGNOSIS — Z794 Long term (current) use of insulin: Secondary | ICD-10-CM

## 2020-11-05 DIAGNOSIS — E559 Vitamin D deficiency, unspecified: Secondary | ICD-10-CM

## 2020-11-05 DIAGNOSIS — I10 Essential (primary) hypertension: Secondary | ICD-10-CM

## 2020-11-05 DIAGNOSIS — Z79899 Other long term (current) drug therapy: Secondary | ICD-10-CM

## 2020-11-05 DIAGNOSIS — E782 Mixed hyperlipidemia: Secondary | ICD-10-CM | POA: Diagnosis not present

## 2020-11-05 DIAGNOSIS — Z720 Tobacco use: Secondary | ICD-10-CM

## 2020-11-05 DIAGNOSIS — M7700 Medial epicondylitis, unspecified elbow: Secondary | ICD-10-CM

## 2020-11-05 MED ORDER — CEPHALEXIN 500 MG PO CAPS: 500.0000 mg | ORAL_CAPSULE | Freq: Three times a day (TID) | ORAL | 0 refills | Status: AC

## 2020-11-05 MED ORDER — CEPHALEXIN 500 MG PO CAPS: 500.0000 mg | ORAL_CAPSULE | Freq: Three times a day (TID) | ORAL | 0 refills | Status: DC

## 2020-11-05 NOTE — Progress Notes (Signed)
Subjective:     Patient ID: Stephen Sampson , male    DOB: 1961/07/03 , 59 y.o.   MRN: [id number]   Chief Complaint  Patient presents with  . Hypertension  . Diabetes    HPI  Patient here for a f/u on dm and htn  Wt Readings from Last 3 Encounters: 11/05/20 : 254 lb 9.6 oz (115.5 kg) 08/05/20 : 258 lb 3.2 oz (117.1 kg) 05/07/20 : 254 lb (115.2 kg)  Hypertension This is a chronic problem. The current episode started more than 1 year ago. The problem is uncontrolled. Pertinent negatives include no anxiety, blurred vision, chest pain, headaches or palpitations. There are no known risk factors for coronary artery disease. Past treatments include angiotensin blockers. There are no compliance problems.  There is no history of angina. There is no history of chronic renal disease.  Diabetes He presents for his follow-up diabetic visit. He has type 2 diabetes mellitus. His disease course has been improving. Pertinent negatives for hypoglycemia include no dizziness or headaches. Pertinent negatives for diabetes include no blurred vision, no chest pain and no fatigue. There are no hypoglycemic complications. Symptoms are stable. There are no diabetic complications. Risk factors for coronary artery disease include obesity and sedentary lifestyle. Current diabetic treatment includes oral agent (dual therapy). He is compliant with treatment all of the time. His weight is decreasing steadily. He is following a generally healthy (trying to watch portions) diet. When asked about meal planning, he reported none. He has not had a previous visit with a dietitian. He participates in exercise intermittently. (Blood sugar this morning was 150, had corn bread muffins and triscuits last night.  ) An ACE inhibitor/angiotensin II receptor blocker is being taken. He does not see a podiatrist.Eye exam is current.     Past Medical History:  Diagnosis Date  . Diabetes mellitus without complication ([place])   .  Hypertension      History reviewed. No pertinent family history.   Current Outpatient Medications:  .  aspirin EC 81 MG tablet, Take 81 mg by mouth daily., Disp: , Rfl:  .  atorvastatin (LIPITOR) 40 MG tablet, TAKE 1 TABLET EVERY EVENING, Disp: 90 tablet, Rfl: 3 .  B-D UF III MINI PEN NEEDLES 31G X 5 MM MISC, USE AS DIRECTED, Disp: 100 each, Rfl: 3 .  clindamycin (CLEOCIN T) 1 % lotion, Apply  as directed to affected area twice a day, Disp: , Rfl:  .  doxycycline (VIBRAMYCIN) 50 MG capsule, Take 50 mg by mouth 2 (two) times daily., Disp: , Rfl:  .  glucose blood (FREESTYLE LITE) test strip, USE AS INSTRUCTED, Disp: 300 strip, Rfl: 3 .  HUMALOG KWIKPEN 100 UNIT/ML KwikPen, INJECT UNDER THE SKIN BEFORE MEALS AS PER INSULIN SLIDING SCALE PROTOCOL, Disp: 45 mL, Rfl: 3 .  insulin glargine (LANTUS) 100 UNIT/ML injection, Inject 0.48 mLs (48 Units total) into the skin at bedtime., Disp: 10 mL, Rfl: 1 .  JANUMET 50-1000 MG tablet, TAKE 1 TABLET TWICE A DAY, Disp: 180 tablet, Rfl: 3 .  LANTUS SOLOSTAR 100 UNIT/ML Solostar Pen, INJECT 50 UNITS UNDER THE SKIN AT BEDTIME, Disp: 135 mL, Rfl: 3 .  temazepam (RESTORIL) 15 MG capsule, TAKE ONE CAPSULE BY MOUTH EVERY NIGHT AT BEDTIME AS NEEDED FOR SLEEP, Disp: 30 capsule, Rfl: 5 .  TRULICITY 1.5 YB/6.3SL SOPN, INJECT 1.5 MG INTO THE SKIN ONCE A WEEK, Disp: 6 mL, Rfl: 3 .  valsartan (DIOVAN) 160 MG tablet, TAKE 1 TABLET DAILY, Disp:  90 tablet, Rfl: 3 .  Vitamin D, Ergocalciferol, (DRISDOL) 1.25 MG (50000 UNIT) CAPS capsule, Take 1 capsule (50,000 Units total) by mouth 2 (two) times a week., Disp: 24 capsule, Rfl: 1 .  cephALEXin (KEFLEX) 500 MG capsule, Take 1 capsule (500 mg total) by mouth 3 (three) times daily for 7 days., Disp: 21 capsule, Rfl: 0   Allergies  Allergen Reactions  . Metformin And Related Diarrhea     Review of Systems  Constitutional: Negative.  Negative for fatigue.  Eyes: Negative for blurred vision.  Respiratory: Negative.    Cardiovascular: Negative for chest pain, palpitations and leg swelling.  Neurological: Negative for dizziness and headaches.  Psychiatric/Behavioral: Negative.      Today's Vitals   11/05/20 0856  BP: 132/80  Pulse: 76  Temp: 98.2 F (36.8 C)  Weight: 254 lb 9.6 oz (115.5 kg)  Height: 5' 10.6" (1.793 m)  PainSc: 0-No pain   Body mass index is 35.91 kg/m.   Objective:  Physical Exam Vitals reviewed.  Constitutional:      General: He is not in acute distress.    Appearance: Normal appearance. He is obese.  Cardiovascular:     Rate and Rhythm: Normal rate and regular rhythm.     Pulses: Normal pulses.     Heart sounds: Normal heart sounds. No murmur heard.   Pulmonary:     Effort: Pulmonary effort is normal. No respiratory distress.     Breath sounds: Normal breath sounds. No wheezing.  Musculoskeletal:        General: Tenderness (left elbow with swollen) present. No swelling or deformity.  Skin:    General: Skin is warm and dry.  Neurological:     General: No focal deficit present.     Mental Status: He is alert and oriented to person, place, and time.     Cranial Nerves: No cranial nerve deficit.  Psychiatric:        Mood and Affect: Mood normal.        Behavior: Behavior normal.        Thought Content: Thought content normal.        Judgment: Judgment normal.          Assessment And Plan:     1. Type 2 diabetes mellitus with other specified complication, with long-term current use of insulin (HCC)  Chronic, stable  Continue with current medications - Hemoglobin A1c  2. Essential hypertension  Chronic, fair control  Continue with current medications, no changes - CMP14 + Anion Gap  3. Vitamin D deficiency  Will check vitamin D level and supplement as needed.     Also encouraged to spend 15 minutes in the sun daily.  - VITAMIN D 25 Hydroxy (Vit-D Deficiency, Fractures)  4. Mixed hyperlipidemia  Chronic, controlled  Continue with current  medications - Lipid panel  5. Medial epicondylitis of elbow, unspecified laterality  Left elbow is swollen and hot to touch, will treat with cephalexin due to concern of cellulitis.  Will refer to orthopedics as the fluid may need to be withdrawn.  - Ambulatory referral to Orthopedic Surgery - cephALEXin (KEFLEX) 500 MG capsule; Take 1 capsule (500 mg total) by mouth 3 (three) times daily for 7 days.  Dispense: 21 capsule; Refill: 0  6. Tobacco abuse  He has a 43 Pack per year history of smoking will send for CT scan low dose. Continues to smoke approximately 0.75 pack daily  - CT CHEST LUNG CA SCREEN LOW DOSE W/O  CM; Future  7. Other long term (current) drug therapy - TSH     Patient was given opportunity to ask questions. Patient verbalized understanding of the plan and was able to repeat key elements of the plan. All questions were answered to their satisfaction.     Teola Bradley, FNP, have reviewed all documentation for this visit. The documentation on 11/05/20 for the exam, diagnosis, procedures, and orders are all accurate and complete.  THE PATIENT IS ENCOURAGED TO PRACTICE SOCIAL DISTANCING DUE TO THE COVID-19 PANDEMIC.

## 2020-11-05 NOTE — Patient Instructions (Signed)

## 2020-11-06 LAB — HEMOGLOBIN A1C
Est. average glucose Bld gHb Est-mCnc: 169 mg/dL
Hgb A1c MFr Bld: 7.5 % — ABNORMAL HIGH (ref 4.8–5.6)

## 2020-11-06 LAB — CMP14 + ANION GAP
ALT: 20 IU/L (ref 0–44)
AST: 12 IU/L (ref 0–40)
Albumin/Globulin Ratio: 1.8 (ref 1.2–2.2)
Albumin: 4.3 g/dL (ref 3.8–4.9)
Alkaline Phosphatase: 84 IU/L (ref 44–121)
Anion Gap: 16 mmol/L (ref 10.0–18.0)
BUN/Creatinine Ratio: 9 — ABNORMAL LOW (ref 10–24)
BUN: 9 mg/dL (ref 8–27)
Bilirubin Total: 0.3 mg/dL (ref 0.0–1.2)
CO2: 20 mmol/L (ref 20–29)
Calcium: 8.9 mg/dL (ref 8.6–10.2)
Chloride: 102 mmol/L (ref 96–106)
Creatinine, Ser: 1.02 mg/dL (ref 0.76–1.27)
GFR calc Af Amer: 92 mL/min/{1.73_m2} (ref >59)
GFR calc non Af Amer: 80 mL/min/{1.73_m2} (ref >59)
Globulin, Total: 2.4 g/dL (ref 1.5–4.5)
Glucose: 174 mg/dL — ABNORMAL HIGH (ref 65–99)
Potassium: 4.6 mmol/L (ref 3.5–5.2)
Sodium: 138 mmol/L (ref 134–144)
Total Protein: 6.7 g/dL (ref 6.0–8.5)

## 2020-11-06 LAB — TSH: TSH: 2.49 u[IU]/mL (ref 0.450–4.500)

## 2020-11-06 LAB — LIPID PANEL
Chol/HDL Ratio: 4.8 ratio (ref 0.0–5.0)
Cholesterol, Total: 130 mg/dL (ref 100–199)
HDL: 27 mg/dL — ABNORMAL LOW (ref >39)
LDL Chol Calc (NIH): 77 mg/dL (ref 0–99)
Triglycerides: 146 mg/dL (ref 0–149)
VLDL Cholesterol Cal: 26 mg/dL (ref 5–40)

## 2020-11-06 LAB — VITAMIN D 25 HYDROXY (VIT D DEFICIENCY, FRACTURES): Vit D, 25-Hydroxy: 60.9 ng/mL (ref 30.0–100.0)

## 2020-12-10 LAB — HM DIABETES EYE EXAM

## 2021-02-10 ENCOUNTER — Other Ambulatory Visit: Payer: Self-pay | Admitting: Nurse Practitioner

## 2021-02-10 DIAGNOSIS — E1169 Type 2 diabetes mellitus with other specified complication: Secondary | ICD-10-CM

## 2021-02-10 DIAGNOSIS — Z794 Long term (current) use of insulin: Secondary | ICD-10-CM

## 2021-02-10 DIAGNOSIS — E559 Vitamin D deficiency, unspecified: Secondary | ICD-10-CM

## 2021-02-25 ENCOUNTER — Ambulatory Visit: Admitting: Nurse Practitioner

## 2021-02-25 ENCOUNTER — Other Ambulatory Visit: Payer: Self-pay

## 2021-02-25 ENCOUNTER — Encounter: Payer: Self-pay | Admitting: Nurse Practitioner

## 2021-02-25 VITALS — BP 144/90 | HR 69 | Temp 97.9°F | Ht 70.6 in | Wt 255.4 lb

## 2021-02-25 DIAGNOSIS — E782 Mixed hyperlipidemia: Secondary | ICD-10-CM | POA: Diagnosis not present

## 2021-02-25 DIAGNOSIS — E1169 Type 2 diabetes mellitus with other specified complication: Secondary | ICD-10-CM

## 2021-02-25 DIAGNOSIS — I10 Essential (primary) hypertension: Secondary | ICD-10-CM

## 2021-02-25 DIAGNOSIS — Z794 Long term (current) use of insulin: Secondary | ICD-10-CM

## 2021-02-25 DIAGNOSIS — Z720 Tobacco use: Secondary | ICD-10-CM

## 2021-02-25 DIAGNOSIS — E559 Vitamin D deficiency, unspecified: Secondary | ICD-10-CM | POA: Diagnosis not present

## 2021-02-25 MED ORDER — VALSARTAN 320 MG PO TABS: 320.0000 mg | ORAL_TABLET | Freq: Every day | ORAL | 1 refills | Status: DC

## 2021-02-25 NOTE — Progress Notes (Signed)
I,Yamilka Roman [employer] as a Education administrator for [employer], FNP.,have documented all relevant documentation on the behalf of [name], FNP,as directed by  [name], FNP while in the presence of [name], [place]. This visit occurred during the SARS-CoV-2 public health emergency.  Safety protocols were in place, including screening questions prior to the visit, additional usage of staff PPE, and extensive cleaning of exam room while observing appropriate contact time as indicated for disinfecting solutions.  Subjective:     Patient ID: Stephen Sampson , male    DOB: 03/04/1961 , 60 y.o.   MRN: [id number]   Chief Complaint  Patient presents with  . Diabetes  . Hypertension    HPI  Patient here for a f/u on dm and htn.  Blood pressure has been elevated over the last few months when going to doctor's office. He is planning to do a debridement next month and tooth extractions.   Wt Readings from Last 3 Encounters: 02/25/21 : 255 lb 6.4 oz (115.8 kg) 11/05/20 : 254 lb 9.6 oz (115.5 kg) 08/05/20 : 258 lb 3.2 oz (117.1 kg)  Diabetes He presents for his follow-up diabetic visit. He has type 2 diabetes mellitus. His disease course has been improving. Pertinent negatives for hypoglycemia include no dizziness or headaches. Pertinent negatives for diabetes include no blurred vision, no chest pain and no fatigue. There are no hypoglycemic complications. Symptoms are stable. There are no diabetic complications. Risk factors for coronary artery disease include obesity and sedentary lifestyle. Current diabetic treatment includes oral agent (dual therapy). He is compliant with treatment all of the time. His weight is decreasing steadily. He is following a generally healthy (trying to watch portions) diet. When asked about meal planning, he reported none. He has not had a previous visit with a dietitian. He participates in exercise intermittently. (Blood sugars have been ranging 91 - 266 (outlier),  has been averaging around 120 before meals. He is trying to limit his grazing. ) An ACE inhibitor/angiotensin II receptor blocker is being taken. He does not see a podiatrist.Eye exam is current.  Hypertension This is a chronic problem. The current episode started more than 1 year ago. The problem is uncontrolled. Pertinent negatives include no anxiety, blurred vision, chest pain, headaches or palpitations. There are no known risk factors for coronary artery disease. Past treatments include angiotensin blockers. There are no compliance problems.  There is no history of angina. There is no history of chronic renal disease.     Past Medical History:  Diagnosis Date  . Diabetes mellitus without complication ([place])   . Hypertension      History reviewed. No pertinent family history.   Current Outpatient Medications:  .  aspirin EC 81 MG tablet, Take 81 mg by mouth daily., Disp: , Rfl:  .  atorvastatin (LIPITOR) 40 MG tablet, TAKE 1 TABLET EVERY EVENING, Disp: 90 tablet, Rfl: 3 .  B-D UF III MINI PEN NEEDLES 31G X 5 MM MISC, USE AS DIRECTED, Disp: 100 each, Rfl: 3 .  clindamycin (CLEOCIN T) 1 % lotion, Apply  as directed to affected area twice a day, Disp: , Rfl:  .  doxycycline (VIBRAMYCIN) 50 MG capsule, Take 50 mg by mouth 2 (two) times daily., Disp: , Rfl:  .  glucose blood (FREESTYLE LITE) test strip, USE AS INSTRUCTED, Disp: 300 strip, Rfl: 3 .  HUMALOG KWIKPEN 100 UNIT/ML KwikPen, INJECT UNDER THE SKIN BEFORE MEALS AS PER INSULIN SLIDING SCALE PROTOCOL, Disp: 45 mL, Rfl: 3 .  insulin glargine (LANTUS) 100 UNIT/ML injection, Inject 0.48 mLs (48 Units total) into the skin at bedtime., Disp: 10 mL, Rfl: 1 .  JANUMET 50-1000 MG tablet, TAKE 1 TABLET TWICE A DAY, Disp: 180 tablet, Rfl: 3 .  LANTUS SOLOSTAR 100 UNIT/ML Solostar Pen, INJECT 50 UNITS UNDER THE SKIN AT BEDTIME, Disp: 135 mL, Rfl: 3 .  temazepam (RESTORIL) 15 MG capsule, TAKE ONE CAPSULE BY MOUTH EVERY NIGHT AT BEDTIME AS NEEDED FOR  SLEEP, Disp: 30 capsule, Rfl: 5 .  TRULICITY 1.5 XT/0.2IO SOPN, INJECT 1.5 MG INTO THE SKIN ONCE A WEEK, Disp: 6 mL, Rfl: 3 .  Vitamin D, Ergocalciferol, (DRISDOL) 1.25 MG (50000 UNIT) CAPS capsule, TAKE 1 CAPSULE TWO TIMES A WEEK, Disp: 24 capsule, Rfl: 3 .  valsartan (DIOVAN) 320 MG tablet, Take 1 tablet (320 mg total) by mouth daily., Disp: 90 tablet, Rfl: 1   Allergies  Allergen Reactions  . Metformin And Related Diarrhea     Review of Systems  Constitutional: Negative for fatigue.  HENT:       He is having several teeth extracted over the next few months will find out the the exact treatment plan in a couple weeks.  Eyes: Negative for blurred vision.  Respiratory: Negative.   Cardiovascular: Negative for chest pain, palpitations and leg swelling.  Neurological: Negative for dizziness and headaches.  Psychiatric/Behavioral: Negative.      Today's Vitals   02/25/21 0827  BP: (!) 144/90  Pulse: 69  Temp: 97.9 F (36.6 C)  TempSrc: Oral  Weight: 255 lb 6.4 oz (115.8 kg)  Height: 5' 10.6" (1.793 m)  PainSc: 0-No pain   Body mass index is 36.03 kg/m.   Objective:  Physical Exam Constitutional:      General: He is not in acute distress.    Appearance: Normal appearance. He is obese.  Cardiovascular:     Rate and Rhythm: Normal rate and regular rhythm.     Pulses: Normal pulses.     Heart sounds: Murmur (grade 2/6) heard.    Pulmonary:     Effort: Pulmonary effort is normal. No respiratory distress.     Breath sounds: Normal breath sounds. No wheezing.  Abdominal:     General: Abdomen is flat. Bowel sounds are normal.     Palpations: Abdomen is soft.  Musculoskeletal:     Cervical back: Normal range of motion and neck supple.  Skin:    General: Skin is warm and dry.     Capillary Refill: Capillary refill takes less than 2 seconds.  Neurological:     General: No focal deficit present.     Mental Status: He is alert and oriented to person, place, and time.      Cranial Nerves: No cranial nerve deficit.     Sensory: No sensory deficit.     Motor: No weakness.  Psychiatric:        Mood and Affect: Mood normal.        Behavior: Behavior normal.        Thought Content: Thought content normal.        Judgment: Judgment normal.         Assessment And Plan:     1. Type 2 diabetes mellitus with other specified complication, with long-term current use of insulin (HCC)   Chronic, HgbA1c was slightly elevated at last visit  Continue with current medications  Will consider adding Farxiga to help with risk for CHF pending lab results - CMP14+EGFR - Hemoglobin A1c  2. Essential hypertension . B/P is consistently elevated over the last several visits and when seen by oral surgeon . CMP ordered to check renal function.  . The importance of regular exercise and dietary modification was stressed to the patient.  . Continues to smoke cigarettes. . Will increase his valsartan to 320 mg daily, he is to have a nurse visit blood pressure check in 2 week. - valsartan (DIOVAN) 320 MG tablet; Take 1 tablet (320 mg total) by mouth daily.  Dispense: 90 tablet; Refill: 1 - CMP14+EGFR  3. Vitamin D deficiency  Will check vitamin D level and supplement as needed.     Also encouraged to spend 15 minutes in the sun daily.  - VITAMIN D 25 Hydroxy (Vit-D Deficiency, Fractures)  4. Mixed hyperlipidemia  Chronic, controlled  Continue with current medications, tolerating well - Lipid panel  5. Tobacco abuse  Continues to smoke, I will follow up on his Low dose CT scan      Patient was given opportunity to ask questions. Patient verbalized understanding of the plan and was able to repeat key elements of the plan. All questions were answered to their satisfaction.  [name], FNP   I, [name], FNP, have reviewed all documentation for this visit. The documentation on 02/25/21 for the exam, diagnosis, procedures, and orders are all accurate and  complete.   THE PATIENT IS ENCOURAGED TO PRACTICE SOCIAL DISTANCING DUE TO THE COVID-19 PANDEMIC.

## 2021-02-25 NOTE — Patient Instructions (Signed)

## 2021-02-26 LAB — CMP14+EGFR
ALT: 17 IU/L (ref 0–44)
AST: 14 IU/L (ref 0–40)
Albumin/Globulin Ratio: 1.5 (ref 1.2–2.2)
Albumin: 4 g/dL (ref 3.8–4.9)
Alkaline Phosphatase: 80 IU/L (ref 44–121)
BUN/Creatinine Ratio: 16 (ref 10–24)
BUN: 14 mg/dL (ref 8–27)
Bilirubin Total: 0.2 mg/dL (ref 0.0–1.2)
CO2: 19 mmol/L — ABNORMAL LOW (ref 20–29)
Calcium: 8.5 mg/dL — ABNORMAL LOW (ref 8.6–10.2)
Chloride: 106 mmol/L (ref 96–106)
Creatinine, Ser: 0.86 mg/dL (ref 0.76–1.27)
Globulin, Total: 2.6 g/dL (ref 1.5–4.5)
Glucose: 186 mg/dL — ABNORMAL HIGH (ref 65–99)
Potassium: 4 mmol/L (ref 3.5–5.2)
Sodium: 137 mmol/L (ref 134–144)
Total Protein: 6.6 g/dL (ref 6.0–8.5)
eGFR: 99 mL/min/{1.73_m2} (ref >59)

## 2021-02-26 LAB — LIPID PANEL
Chol/HDL Ratio: 3.9 ratio (ref 0.0–5.0)
Cholesterol, Total: 101 mg/dL (ref 100–199)
HDL: 26 mg/dL — ABNORMAL LOW (ref >39)
LDL Chol Calc (NIH): 56 mg/dL (ref 0–99)
Triglycerides: 101 mg/dL (ref 0–149)
VLDL Cholesterol Cal: 19 mg/dL (ref 5–40)

## 2021-02-26 LAB — HEMOGLOBIN A1C
Est. average glucose Bld gHb Est-mCnc: 148 mg/dL
Hgb A1c MFr Bld: 6.8 % — ABNORMAL HIGH (ref 4.8–5.6)

## 2021-02-26 LAB — VITAMIN D 25 HYDROXY (VIT D DEFICIENCY, FRACTURES): Vit D, 25-Hydroxy: 78.6 ng/mL (ref 30.0–100.0)

## 2021-02-27 ENCOUNTER — Encounter: Payer: Self-pay | Admitting: Nurse Practitioner

## 2021-02-27 DIAGNOSIS — H40003 Preglaucoma, unspecified, bilateral: Secondary | ICD-10-CM | POA: Insufficient documentation

## 2021-03-11 ENCOUNTER — Other Ambulatory Visit: Payer: Self-pay | Admitting: Nurse Practitioner

## 2021-03-11 ENCOUNTER — Encounter: Payer: Self-pay | Admitting: Podiatry

## 2021-03-11 ENCOUNTER — Other Ambulatory Visit: Payer: Self-pay

## 2021-03-11 ENCOUNTER — Ambulatory Visit

## 2021-03-11 ENCOUNTER — Ambulatory Visit: Admitting: Podiatry

## 2021-03-11 VITALS — BP 138/110 | HR 89 | Temp 98.1°F | Ht 70.0 in | Wt 252.4 lb

## 2021-03-11 DIAGNOSIS — I10 Essential (primary) hypertension: Secondary | ICD-10-CM

## 2021-03-11 DIAGNOSIS — M79675 Pain in left toe(s): Secondary | ICD-10-CM | POA: Diagnosis not present

## 2021-03-11 DIAGNOSIS — M79674 Pain in right toe(s): Secondary | ICD-10-CM

## 2021-03-11 DIAGNOSIS — B351 Tinea unguium: Secondary | ICD-10-CM | POA: Diagnosis not present

## 2021-03-11 DIAGNOSIS — Z794 Long term (current) use of insulin: Secondary | ICD-10-CM

## 2021-03-11 DIAGNOSIS — E119 Type 2 diabetes mellitus without complications: Secondary | ICD-10-CM | POA: Insufficient documentation

## 2021-03-11 DIAGNOSIS — Z1211 Encounter for screening for malignant neoplasm of colon: Secondary | ICD-10-CM | POA: Insufficient documentation

## 2021-03-11 DIAGNOSIS — E1165 Type 2 diabetes mellitus with hyperglycemia: Secondary | ICD-10-CM | POA: Diagnosis not present

## 2021-03-11 MED ORDER — HYDROCHLOROTHIAZIDE 12.5 MG PO TABS: 12.5000 mg | ORAL_TABLET | Freq: Every day | ORAL | 1 refills | Status: DC

## 2021-03-11 NOTE — Progress Notes (Signed)
Pt is here today for a BPC, he does drink plenty of water, stays away from the fried foods, but he does snack a lot. He does not exercise. He also brought in his BP meter today and I took his BP as well.   on his meter: 203/104 Provider wants him to come back in 2 weeks start him on hydrocholorithizide 12.5mg . and to continue the valsartan 320mg   BP Readings from Last 3 Encounters:  03/11/21 (!) 138/110  02/25/21 (!) 144/90  11/05/20 132/80

## 2021-03-15 NOTE — Progress Notes (Signed)
Subjective: Stephen Sampson presents today preventative diabetic foot care and painful callus(es) b/l and painful mycotic toenails b/l that are difficult to trim. Pain interferes with ambulation. Aggravating factors include wearing enclosed shoe gear. Pain is relieved with periodic professional debridement.   His blood glucose was 140 mg/dl this morning.   Stephen Brine, FNP is patient's PCP. Last visit was: 02/25/2021.  He voices no new pedal concerns on today's visit.    Allergies  Allergen Reactions  . Metformin And Related Diarrhea    Objective: Stephen Sampson is a pleasant 60 y.o.  Caucasian male, in NAD. AAO x 3.  There were no vitals filed for this visit.  Vascular Examination: Neurovascular status unchanged b/l lower extremities. Capillary refill time to digits immediate b/l. Palpable DP pulses b/l. Palpable PT pulses b/l. Pedal hair present b/l. Skin temperature gradient within normal limits b/l. No pain with calf compression b/l.  Dermatological Examination: Pedal skin with normal turgor, texture and tone bilaterally. No open wounds bilaterally. Toenails 1-5 b/l elongated, discolored, dystrophic, thickened, crumbly with subungual debris and tenderness to dorsal palpation. No hyperkeratotic nor porokeratotic lesions present on today's visit.  Musculoskeletal: Normal muscle strength 5/5 to all lower extremity muscle groups bilaterally. No pain crepitus or joint limitation noted with ROM b/l. Hammertoes noted to the b/l lower extremities. Patient ambulates independent of any assistive aids.  Neurological Examination: Protective sensation intact 5/5 intact bilaterally with 10g monofilament b/l. Vibratory sensation intact b/l. Proprioception intact bilaterally.  Assessment: 1. Pain due to onychomycosis of toenails of both feet   2. Controlled type 2 diabetes mellitus with hyperglycemia, with long-term current use of insulin ([place])     Plan: -Examined patient. -No  new findings. No new orders. -Continue diabetic foot care principles. -Toenails 1-5 b/l were debrided in length and girth with sterile nail nippers and dremel without iatrogenic bleeding.  -Patient to report any pedal injuries to medical professional immediately. -Patient to continue soft, supportive shoe gear daily. -Patient/POA to call should there be question/concern in the interim.  Return in about 3 months (around 06/11/2021).  Stephen Sampson, DPM

## 2021-03-25 ENCOUNTER — Ambulatory Visit

## 2021-03-25 ENCOUNTER — Other Ambulatory Visit: Payer: Self-pay

## 2021-03-25 VITALS — BP 130/78 | HR 96 | Temp 97.6°F | Resp 99 | Ht 72.0 in | Wt 248.8 lb

## 2021-03-25 DIAGNOSIS — I10 Essential (primary) hypertension: Secondary | ICD-10-CM

## 2021-03-25 NOTE — Progress Notes (Signed)
Pt is here today for a BPC, he did take his medication this morning takes it at 9am everyday. He tries to get a mile in 3 times a week, doing yard work as well. BP Readings from Last 3 Encounters:  03/25/21 130/78  03/11/21 (!) 138/110  02/25/21 (!) 144/90   Provider stated BP was much better, for him to continue his current medication, and will see him in June for a follow up.

## 2021-04-03 ENCOUNTER — Encounter: Payer: Self-pay | Admitting: Nurse Practitioner

## 2021-04-07 ENCOUNTER — Other Ambulatory Visit: Payer: Self-pay

## 2021-04-07 MED ORDER — JANUMET 50-1000 MG PO TABS: 1.0000 | ORAL_TABLET | Freq: Two times a day (BID) | ORAL | 3 refills | Status: DC

## 2021-04-25 ENCOUNTER — Other Ambulatory Visit: Payer: Self-pay | Admitting: Nurse Practitioner

## 2021-05-13 ENCOUNTER — Other Ambulatory Visit: Payer: Self-pay | Admitting: Nurse Practitioner

## 2021-05-13 DIAGNOSIS — G4700 Insomnia, unspecified: Secondary | ICD-10-CM

## 2021-05-13 NOTE — Telephone Encounter (Signed)
temazepam

## 2021-05-14 ENCOUNTER — Telehealth: Payer: Self-pay | Admitting: Nurse Practitioner

## 2021-05-14 DIAGNOSIS — I2510 Atherosclerotic heart disease of native coronary artery without angina pectoris: Secondary | ICD-10-CM

## 2021-05-14 DIAGNOSIS — I2584 Coronary atherosclerosis due to calcified coronary lesion: Secondary | ICD-10-CM

## 2021-05-14 NOTE — Telephone Encounter (Signed)
Called to give results of low dose CT scan of lungs. He has a solid nodule to the base of left lung, at this time  No additional testing advised to repeat in 1 year. He also has coronary artery calcifications, he is already on a statin and he has gallstones denies any symptoms. He is aware if has pain, nausea or vomiting we may need to look more into this. He is also on a statin

## 2021-05-27 ENCOUNTER — Ambulatory Visit: Admitting: Nurse Practitioner

## 2021-06-24 ENCOUNTER — Other Ambulatory Visit: Payer: Self-pay

## 2021-06-24 ENCOUNTER — Encounter: Payer: Self-pay | Admitting: Podiatry

## 2021-06-24 ENCOUNTER — Ambulatory Visit (INDEPENDENT_AMBULATORY_CARE_PROVIDER_SITE_OTHER): Admitting: Podiatry

## 2021-06-24 DIAGNOSIS — Z794 Long term (current) use of insulin: Secondary | ICD-10-CM | POA: Diagnosis not present

## 2021-06-24 DIAGNOSIS — E1165 Type 2 diabetes mellitus with hyperglycemia: Secondary | ICD-10-CM | POA: Diagnosis not present

## 2021-06-24 DIAGNOSIS — B351 Tinea unguium: Secondary | ICD-10-CM | POA: Diagnosis not present

## 2021-06-24 DIAGNOSIS — M79675 Pain in left toe(s): Secondary | ICD-10-CM

## 2021-06-24 DIAGNOSIS — M79674 Pain in right toe(s): Secondary | ICD-10-CM

## 2021-06-24 NOTE — Progress Notes (Signed)
  Subjective:  Patient ID: Stephen Sampson, male    DOB: 01/11/1961,  MRN: [id number]  60 y.o. male presents preventative diabetic foot care and painful thick toenails that are difficult to trim. Pain interferes with ambulation. Aggravating factors include wearing enclosed shoe gear. Pain is relieved with periodic professional debridement.  Patient states blood glucose was 233 mg/dl on yesterday.  He voices no new pedal problems on today's visit.  PCP is [name], FNP , and last visit was 04/24/2021.  Allergies  Allergen Reactions   Metformin And Related Diarrhea    Review of Systems: Negative except as noted in the HPI.   Objective:  Vascular Examination: Vascular status intact b/l with palpable pedal pulses. CFT immediate b/l. No edema. No pain with calf compression b/l. Skin temperature gradient WNL b/l. Capillary refill time to digits immediate b/l. No pain with calf compression b/l.  Neurological Examination: Protective sensation intact 5/5 intact bilaterally with 10g monofilament b/l. Vibratory sensation intact b/l.  Dermatological Examination: Pedal skin with normal turgor, texture and tone b/l. Toenails 1-5 b/l thick, discolored, elongated with subungual debris and pain on dorsal palpation.No hyperkeratotic lesions noted b/l. No open wounds b/l lower extremities. No interdigital macerations b/l lower extremities  Musculoskeletal Examination: Muscle strength 5/5 to b/l LE. No gross bony deformities b/l. Hammertoe(s) noted to the b/l feet.  Radiographs: None Assessment:   1. Pain due to onychomycosis of toenails of both feet   2. Controlled type 2 diabetes mellitus with hyperglycemia, with long-term current use of insulin ([place])    Plan:  -Patient to continue soft, supportive shoe gear daily. -Toenails 1-5 b/l were debrided in length and girth with sterile nail nippers and dremel without iatrogenic bleeding.  -Patient to report any pedal injuries to medical  professional immediately. -Patient/POA to call should there be question/concern in the interim.  Return in about 3 months (around 09/24/2021).  Marzetta Board, DPM

## 2021-07-09 ENCOUNTER — Ambulatory Visit (INDEPENDENT_AMBULATORY_CARE_PROVIDER_SITE_OTHER): Admitting: Nurse Practitioner

## 2021-07-09 ENCOUNTER — Encounter: Payer: Self-pay | Admitting: Nurse Practitioner

## 2021-07-09 ENCOUNTER — Other Ambulatory Visit: Payer: Self-pay

## 2021-07-09 VITALS — BP 114/66 | HR 78 | Temp 98.1°F | Ht 72.0 in | Wt 250.4 lb

## 2021-07-09 DIAGNOSIS — Z6833 Body mass index (BMI) 33.0-33.9, adult: Secondary | ICD-10-CM

## 2021-07-09 DIAGNOSIS — Z23 Encounter for immunization: Secondary | ICD-10-CM | POA: Diagnosis not present

## 2021-07-09 DIAGNOSIS — E1169 Type 2 diabetes mellitus with other specified complication: Secondary | ICD-10-CM | POA: Diagnosis not present

## 2021-07-09 DIAGNOSIS — E6609 Other obesity due to excess calories: Secondary | ICD-10-CM | POA: Diagnosis not present

## 2021-07-09 DIAGNOSIS — Z794 Long term (current) use of insulin: Secondary | ICD-10-CM

## 2021-07-09 DIAGNOSIS — I10 Essential (primary) hypertension: Secondary | ICD-10-CM

## 2021-07-09 MED ORDER — SHINGRIX 50 MCG/0.5ML IM SUSR: 0.5000 mL | Freq: Once | INTRAMUSCULAR | 0 refills | Status: AC

## 2021-07-09 MED ORDER — LANTUS SOLOSTAR 100 UNIT/ML ~~LOC~~ SOPN: 48.0000 [IU] | PEN_INJECTOR | Freq: Every day | SUBCUTANEOUS | 3 refills | Status: DC

## 2021-07-09 NOTE — Patient Instructions (Signed)

## 2021-07-09 NOTE — Progress Notes (Signed)
I,Katawbba Wiggins,acting as a Education administrator for [employer], FNP.,have documented all relevant documentation on the behalf of [name], FNP,as directed by  [name], FNP while in the presence of [name], [place].   This visit occurred during the SARS-CoV-2 public health emergency.  Safety protocols were in place, including screening questions prior to the visit, additional usage of staff PPE, and extensive cleaning of exam room while observing appropriate contact time as indicated for disinfecting solutions.  Subjective:     Patient ID: Stephen Sampson , male    DOB: 02-26-1961 , 60 y.o.   MRN: [id number]   Chief Complaint  Patient presents with   Diabetes   Hypertension    HPI  Patient here for a f/u on dm and htn.  Admits to stress eating due to his wife having cancer to the parotid gland and has metastasis to other areas of her body. She is currently in the palliative phase. He does have one living child who lives locally.   Wt Readings from Last 3 Encounters: 07/09/21 : 250 lb 6.4 oz (113.6 kg) 03/25/21 : 248 lb 12.8 oz (112.9 kg) 03/11/21 : 252 lb 6.4 oz (114.5 kg)      Diabetes He presents for his follow-up diabetic visit. He has type 2 diabetes mellitus. His disease course has been improving. Pertinent negatives for hypoglycemia include no dizziness or headaches. Pertinent negatives for diabetes include no blurred vision, no chest pain and no fatigue. There are no hypoglycemic complications. Symptoms are stable. There are no diabetic complications. Risk factors for coronary artery disease include obesity and sedentary lifestyle. Current diabetic treatment includes oral agent (dual therapy). He is compliant with treatment all of the time. His weight is decreasing steadily. He is following a generally healthy (trying to watch portions) diet. When asked about meal planning, he reported none. He has not had a previous visit with a dietitian. He participates in exercise  intermittently. (Blood sugar this morning was 139, has mostly been in the 200's related to stress eating. ) An ACE inhibitor/angiotensin II receptor blocker is being taken. He does not see a podiatrist.Eye exam is current.  Hypertension This is a chronic problem. The current episode started more than 1 year ago. The problem is uncontrolled. Pertinent negatives include no anxiety, blurred vision, chest pain, headaches or palpitations. There are no known risk factors for coronary artery disease. Past treatments include angiotensin blockers. There are no compliance problems.  There is no history of angina. There is no history of chronic renal disease.    Past Medical History:  Diagnosis Date   Diabetes mellitus without complication ([place])    Hypertension      History reviewed. No pertinent family history.   Current Outpatient Medications:    aspirin EC 81 MG tablet, Take 81 mg by mouth daily., Disp: , Rfl:    atorvastatin (LIPITOR) 40 MG tablet, TAKE 1 TABLET EVERY EVENING, Disp: 90 tablet, Rfl: 3   B-D UF III MINI PEN NEEDLES 31G X 5 MM MISC, USE AS DIRECTED, Disp: 100 each, Rfl: 3   clindamycin (CLEOCIN T) 1 % lotion, Apply  as directed to affected area twice a day, Disp: , Rfl:    doxycycline (VIBRAMYCIN) 50 MG capsule, Take 50 mg by mouth 2 (two) times daily., Disp: , Rfl:    glucose blood (FREESTYLE LITE) test strip, USE AS INSTRUCTED, Disp: 300 strip, Rfl: 3   HUMALOG KWIKPEN 100 UNIT/ML KwikPen, INJECT UNDER THE SKIN BEFORE MEALS AS PER INSULIN  SLIDING SCALE PROTOCOL, Disp: 45 mL, Rfl: 3   hydrochlorothiazide (HYDRODIURIL) 12.5 MG tablet, Take 1 tablet (12.5 mg total) by mouth daily., Disp: 90 tablet, Rfl: 1   insulin glargine (LANTUS) 100 UNIT/ML injection, Inject 0.48 mLs (48 Units total) into the skin at bedtime., Disp: 10 mL, Rfl: 1   sitaGLIPtin-metformin (JANUMET) 50-1000 MG tablet, Take 1 tablet by mouth 2 (two) times daily., Disp: 180 tablet, Rfl: 3   temazepam (RESTORIL) 15 MG  capsule, TAKE ONE CAPSULE BY MOUTH EVERY NIGHT AT BEDTIME AS NEEDED FOR SLEEP, Disp: 30 capsule, Rfl: 2   TRULICITY 1.5 XB/2.8UX SOPN, INJECT 1.5 MG INTO THE SKIN ONCE A WEEK, Disp: 6 mL, Rfl: 3   valsartan (DIOVAN) 320 MG tablet, , Disp: , Rfl:    Vitamin D, Ergocalciferol, (DRISDOL) 1.25 MG (50000 UNIT) CAPS capsule, TAKE 1 CAPSULE TWO TIMES A WEEK, Disp: 24 capsule, Rfl: 3   Zoster Vaccine Adjuvanted (SHINGRIX) injection, Inject 0.5 mLs into the muscle once for 1 dose., Disp: 0.5 mL, Rfl: 0   LANTUS SOLOSTAR 100 UNIT/ML Solostar Pen, INJECT 50 UNITS UNDER THE SKIN AT BEDTIME, Disp: 135 mL, Rfl: 3   SITagliptin-metFORMIN HCl (JANUMET XR PO), , Disp: , Rfl:    Allergies  Allergen Reactions   Metformin And Related Diarrhea     Review of Systems  Constitutional: Negative.  Negative for fatigue.  Eyes:  Negative for blurred vision.  Respiratory: Negative.    Cardiovascular: Negative.  Negative for chest pain and palpitations.  Gastrointestinal: Negative.   Neurological:  Negative for dizziness and headaches.  Psychiatric/Behavioral: Negative.    All other systems reviewed and are negative.   Today's Vitals   07/09/21 0837  BP: 114/66  Pulse: 78  Temp: 98.1 F (36.7 C)  TempSrc: Oral  Weight: 250 lb 6.4 oz (113.6 kg)  Height: 6' (1.829 m)   Body mass index is 33.96 kg/m.  Wt Readings from Last 3 Encounters:  07/09/21 250 lb 6.4 oz (113.6 kg)  03/25/21 248 lb 12.8 oz (112.9 kg)  03/11/21 252 lb 6.4 oz (114.5 kg)    BP Readings from Last 3 Encounters:  07/09/21 114/66  03/25/21 130/78  03/11/21 (!) 138/110    Objective:  Physical Exam Constitutional:      General: He is not in acute distress.    Appearance: Normal appearance. He is obese.  Cardiovascular:     Rate and Rhythm: Normal rate and regular rhythm.     Pulses: Normal pulses.     Heart sounds: Murmur (grade 2/6) heard.  Pulmonary:     Effort: Pulmonary effort is normal. No respiratory distress.     Breath  sounds: Normal breath sounds. No wheezing.  Abdominal:     General: Abdomen is flat. Bowel sounds are normal.     Palpations: Abdomen is soft.  Musculoskeletal:     Cervical back: Normal range of motion and neck supple.  Skin:    General: Skin is warm and dry.     Capillary Refill: Capillary refill takes less than 2 seconds.  Neurological:     General: No focal deficit present.     Mental Status: He is alert and oriented to person, place, and time.     Cranial Nerves: No cranial nerve deficit.     Sensory: No sensory deficit.     Motor: No weakness.  Psychiatric:        Mood and Affect: Mood normal.        Behavior: Behavior  normal.        Thought Content: Thought content normal.        Judgment: Judgment normal.        Assessment And Plan:     1. Type 2 diabetes mellitus with other specified complication, with long-term current use of insulin (HCC) Comments: HgbA1c had improved at last visit.  Continue current medications pending lab results.  - Hemoglobin A1c  2. Essential hypertension Comments: Stable, more improved since last visit, well controlled this visit Continue with current medications - BMP8+EGFR  3. Class 1 obesity due to excess calories with serious comorbidity and body mass index (BMI) of 33.0 to 33.9 in adult Comments: He is encouraged to initially strive for BMI less than 30 to decrease cardiac risk. He is advised to exercise no less than 150 minutes per week.    4. Immunization due Comments: Rx for Shingrix sent to pharmacy and information given  - Zoster Vaccine Adjuvanted [facility]) injection; Inject 0.5 mLs into the muscle once for 1 dose.  Dispense: 0.5 mL; Refill: 0    Patient was given opportunity to ask questions. Patient verbalized understanding of the plan and was able to repeat key elements of the plan. All questions were answered to their satisfaction.  [name], FNP   I, [name], FNP, have reviewed all documentation for this visit.  The documentation on 07/09/21 for the exam, diagnosis, procedures, and orders are all accurate and complete.   IF YOU HAVE BEEN REFERRED TO A SPECIALIST, IT MAY TAKE 1-2 WEEKS TO SCHEDULE/PROCESS THE REFERRAL. IF YOU HAVE NOT HEARD FROM US/SPECIALIST IN TWO WEEKS, PLEASE GIVE [place] A CALL AT [phone].   THE PATIENT IS ENCOURAGED TO PRACTICE SOCIAL DISTANCING DUE TO THE COVID-19 PANDEMIC.

## 2021-07-10 LAB — BMP8+EGFR
BUN/Creatinine Ratio: 11 (ref 10–24)
BUN: 12 mg/dL (ref 8–27)
CO2: 27 mmol/L (ref 20–29)
Calcium: 9.5 mg/dL (ref 8.6–10.2)
Chloride: 102 mmol/L (ref 96–106)
Creatinine, Ser: 1.1 mg/dL (ref 0.76–1.27)
Glucose: 161 mg/dL — ABNORMAL HIGH (ref 65–99)
Potassium: 5 mmol/L (ref 3.5–5.2)
Sodium: 141 mmol/L (ref 134–144)
eGFR: 77 mL/min/{1.73_m2} (ref >59)

## 2021-07-10 LAB — HEMOGLOBIN A1C
Est. average glucose Bld gHb Est-mCnc: 180 mg/dL
Hgb A1c MFr Bld: 7.9 % — ABNORMAL HIGH (ref 4.8–5.6)

## 2021-07-13 ENCOUNTER — Other Ambulatory Visit: Payer: Self-pay | Admitting: Nurse Practitioner

## 2021-08-08 ENCOUNTER — Other Ambulatory Visit: Payer: Self-pay | Admitting: Nurse Practitioner

## 2021-08-08 DIAGNOSIS — G4700 Insomnia, unspecified: Secondary | ICD-10-CM

## 2021-08-11 ENCOUNTER — Encounter: Admitting: Nurse Practitioner

## 2021-08-12 ENCOUNTER — Encounter: Admitting: Nurse Practitioner

## 2021-09-03 ENCOUNTER — Other Ambulatory Visit: Payer: Self-pay | Admitting: Nurse Practitioner

## 2021-09-03 DIAGNOSIS — I10 Essential (primary) hypertension: Secondary | ICD-10-CM

## 2021-09-30 ENCOUNTER — Ambulatory Visit (INDEPENDENT_AMBULATORY_CARE_PROVIDER_SITE_OTHER): Admitting: Podiatry

## 2021-09-30 ENCOUNTER — Encounter: Payer: Self-pay | Admitting: Podiatry

## 2021-09-30 ENCOUNTER — Other Ambulatory Visit: Payer: Self-pay

## 2021-09-30 DIAGNOSIS — M79674 Pain in right toe(s): Secondary | ICD-10-CM | POA: Diagnosis not present

## 2021-09-30 DIAGNOSIS — B351 Tinea unguium: Secondary | ICD-10-CM | POA: Diagnosis not present

## 2021-09-30 DIAGNOSIS — M79675 Pain in left toe(s): Secondary | ICD-10-CM

## 2021-09-30 DIAGNOSIS — S99919A Unspecified injury of unspecified ankle, initial encounter: Secondary | ICD-10-CM

## 2021-09-30 DIAGNOSIS — E1165 Type 2 diabetes mellitus with hyperglycemia: Secondary | ICD-10-CM

## 2021-10-04 NOTE — Progress Notes (Signed)
  Subjective:  Patient ID: Stephen Sampson, male    DOB: [birth year],  MRN: [id number]  Stephen Sampson presents to clinic today for preventative diabetic foot care and thick, elongated toenails b/l feet which are tender when wearing enclosed shoe gear.  Patient states blood glucose was 169 mg/dl on yesterday.  Patient did not check blood glucose today.  He states he rolled his right ankle on last Monday. Both were bruised, He relates no pain nor difficulty ambulating on today.  PCP is [name], FNP , and last visit was 04/24/2021.  Allergies  Allergen Reactions   Metformin And Related Diarrhea    Review of Systems: Negative except as noted in the HPI. Objective:   Constitutional Stephen Sampson is a pleasant 60 y.o. Caucasian male, in NAD. AAO x 3.   Vascular Capillary refill time to digits immediate b/l lower extremities. Palpable DP pulse(s) b/l lower extremities Palpable PT pulse(s) b/l lower extremities Lower extremity skin temperature gradient within normal limits. Ecchymosis noted medial aspect of bilateral ankles. Trace edema noted b/l. [name] No pain with calf compression b/l. No cyanosis or clubbing noted.  Neurologic Normal speech. Oriented to person, place, and time. Protective sensation intact 5/5 intact bilaterally with 10g monofilament b/l. Vibratory sensation intact b/l.  Dermatologic Skin warm and supple b/l lower extremities. Early callus formation submet head 5 b/l. No open wounds b/l LE. No interdigital macerations b/l lower extremities. Toenails 1-5 b/l elongated, discolored, dystrophic, thickened, crumbly with subungual debris and tenderness to dorsal palpation.  Orthopedic: Normal muscle strength 5/5 to all lower extremity muscle groups bilaterally. No pain on active ROM. No gross bony deformities b/l lower extremities. Hammertoe(s) noted to the 2-5 bilaterally.   Radiographs: None Assessment:   1. Pain due to onychomycosis of toenails of both feet    2. Controlled type 2 diabetes mellitus with hyperglycemia, with long-term current use of insulin ([place])   3. Ankle injury, unspecified laterality, initial encounter    Plan:  Patient was evaluated and treated and all questions answered. Consent given for treatment as described below: -Examined patient. -Patient rolled ankles last Monday. Ecchymosis resolving. Remains asymptomatic. Monitor. He will call if he has any changes. -Continue diabetic foot care principles: inspect feet daily, monitor glucose as recommended by PCP and/or Endocrinologist, and follow prescribed diet per PCP, Endocrinologist and/or dietician. -Patient to continue soft, supportive shoe gear daily. -Toenails 1-5 b/l were debrided in length and girth with sterile nail nippers and dremel without iatrogenic bleeding.  -Patient to report any pedal injuries to medical professional immediately. -Patient/POA to call should there be question/concern in the interim.  Return in about 3 months (around 12/31/2021).  Marzetta Board, DPM

## 2021-10-07 ENCOUNTER — Other Ambulatory Visit: Payer: Self-pay

## 2021-10-07 ENCOUNTER — Encounter: Payer: Self-pay | Admitting: Nurse Practitioner

## 2021-10-07 ENCOUNTER — Ambulatory Visit (INDEPENDENT_AMBULATORY_CARE_PROVIDER_SITE_OTHER): Admitting: Nurse Practitioner

## 2021-10-07 VITALS — BP 146/90 | HR 96 | Temp 98.1°F | Ht 72.0 in | Wt 256.0 lb

## 2021-10-07 DIAGNOSIS — E6609 Other obesity due to excess calories: Secondary | ICD-10-CM

## 2021-10-07 DIAGNOSIS — E1169 Type 2 diabetes mellitus with other specified complication: Secondary | ICD-10-CM

## 2021-10-07 DIAGNOSIS — Z0000 Encounter for general adult medical examination without abnormal findings: Secondary | ICD-10-CM

## 2021-10-07 DIAGNOSIS — Z794 Long term (current) use of insulin: Secondary | ICD-10-CM

## 2021-10-07 DIAGNOSIS — I10 Essential (primary) hypertension: Secondary | ICD-10-CM | POA: Diagnosis not present

## 2021-10-07 DIAGNOSIS — Z6834 Body mass index (BMI) 34.0-34.9, adult: Secondary | ICD-10-CM

## 2021-10-07 NOTE — Progress Notes (Signed)
I, Stephen Sampson,acting as a Education administrator for [employer], FNP.,have documented all relevant documentation on the behalf of Stephen Brine, FNP,as directed by  Stephen Brine, FNP while in the presence of Stephen Sampson, [place].   This visit occurred during the SARS-CoV-2 public health emergency.  Safety protocols were in place, including screening questions prior to the visit, additional usage of staff PPE, and extensive cleaning of exam room while observing appropriate contact time as indicated for disinfecting solutions.  Subjective:     Patient ID: Stephen Sampson , male    DOB: February 18, 1961 , 60 y.o.   MRN: [id number]   Chief Complaint  Patient presents with  . Annual Exam     HPI  Pt presents today for HM.   Diabetes He presents for his follow-up diabetic visit. He has type 2 diabetes mellitus. His disease course has been improving. Pertinent negatives for hypoglycemia include no dizziness or headaches. Pertinent negatives for diabetes include no blurred vision, no chest pain, no fatigue, no polydipsia, no polyphagia and no polyuria. There are no hypoglycemic complications. Symptoms are stable. There are no diabetic complications. Risk factors for coronary artery disease include obesity, sedentary lifestyle, male sex, hypertension, dyslipidemia and diabetes mellitus. Current diabetic treatment includes oral agent (triple therapy). He is compliant with treatment most of the time (only takes Janumet once a day due to his routine). His weight is decreasing steadily. He is following a generally unhealthy (trying to watch portions) diet. When asked about meal planning, he reported none. He has not had a previous visit with a dietitian. He participates in exercise intermittently. His overall blood glucose range is 110-130 mg/dl. (Blood sugars have been 120-160's, has not exceeded 200 in the last 2 months. He is trying to keep the snacks out of his office during the day. ) An ACE inhibitor/angiotensin II  receptor blocker is being taken. He does not see a podiatrist.Eye exam is current.  Hypertension This is a chronic problem. The current episode started more than 1 year ago. The problem is uncontrolled. Pertinent negatives include no anxiety, blurred vision, chest pain, headaches or palpitations. There are no known risk factors for coronary artery disease. Past treatments include angiotensin blockers. There are no compliance problems.  There is no history of angina. There is no history of chronic renal disease.    Past Medical History:  Diagnosis Date  . Diabetes mellitus without complication ([place])   . Hypertension      History reviewed. No pertinent family history.   Current Outpatient Medications:  .  aspirin EC 81 MG tablet, Take 81 mg by mouth daily., Disp: , Rfl:  .  atorvastatin (LIPITOR) 40 MG tablet, TAKE 1 TABLET EVERY EVENING, Disp: 90 tablet, Rfl: 3 .  B-D UF III MINI PEN NEEDLES 31G X 5 MM MISC, USE AS DIRECTED, Disp: 100 each, Rfl: 3 .  clindamycin (CLEOCIN T) 1 % lotion, Apply  as directed to affected area twice a day, Disp: , Rfl:  .  doxycycline (VIBRAMYCIN) 50 MG capsule, Take 50 mg by mouth 2 (two) times daily., Disp: , Rfl:  .  glucose blood (FREESTYLE LITE) test strip, USE AS INSTRUCTED, Disp: 300 strip, Rfl: 3 .  HUMALOG KWIKPEN 100 UNIT/ML KwikPen, INJECT UNDER THE SKIN BEFORE MEALS AS PER INSULIN SLIDING SCALE PROTOCOL, Disp: 45 mL, Rfl: 3 .  hydrochlorothiazide (HYDRODIURIL) 12.5 MG tablet, TAKE ONE TABLET BY MOUTH DAILY, Disp: 90 tablet, Rfl: 1 .  insulin glargine (LANTUS SOLOSTAR) 100 UNIT/ML Solostar Pen, Inject  48 Units into the skin at bedtime., Disp: 135 mL, Rfl: 3 .  sitaGLIPtin-metformin (JANUMET) 50-1000 MG tablet, Take 1 tablet by mouth 2 (two) times daily., Disp: 180 tablet, Rfl: 3 .  SITagliptin-metFORMIN HCl (JANUMET XR PO), , Disp: , Rfl:  .  temazepam (RESTORIL) 15 MG capsule, TAKE ONE CAPSULE BY MOUTH EVERY NIGHT AT BEDTIME AS NEEDED FOR SLEEP, Disp:  30 capsule, Rfl: 5 .  TRULICITY 1.5 AQ/7.6AU SOPN, INJECT 1.5 MG INTO THE SKIN ONCE A WEEK, Disp: 6 mL, Rfl: 3 .  valsartan (DIOVAN) 320 MG tablet, TAKE 1 TABLET DAILY, Disp: 90 tablet, Rfl: 3 .  Vitamin D, Ergocalciferol, (DRISDOL) 1.25 MG (50000 UNIT) CAPS capsule, TAKE 1 CAPSULE TWO TIMES A WEEK, Disp: 24 capsule, Rfl: 3   Allergies  Allergen Reactions  . Metformin And Related Diarrhea     Men's preventive visit. Patient Health Questionnaire (PHQ-2) is  [place] [facility] Visit from 02/25/2021 in [facility]  PHQ-2 Total Score 0      Patient is on a Regular diet; he is trying not to graze.  Not exercising at this time. Marital status: Married. Relevant history for alcohol use is:  Social History   Substance and Sexual Activity  Alcohol Use Yes   Comment: occ    Relevant history for tobacco use is:  Social History   Tobacco Use  Smoking Status Every Day  . Packs/day: 1.00  . Years: 43.00  . Pack years: 43.00  . Types: Cigarettes  Smokeless Tobacco Never  Tobacco Comments   he has not quit smoking at this time; he is not smoking in the car and in the house .     Review of Systems  Constitutional: Negative.  Negative for fatigue.  HENT: Negative.    Eyes: Negative.  Negative for blurred vision.  Respiratory: Negative.  Negative for cough.   Cardiovascular: Negative.  Negative for chest pain, palpitations and leg swelling.  Gastrointestinal: Negative.   Endocrine: Negative for polydipsia, polyphagia and polyuria.  Musculoskeletal: Negative.        Right ankle twisted 2 weeks ago and fell down while standing on a side walk.  Able to walk on his right foot without difficulty. Has been improving since the incident.  Skin: Negative.   Allergic/Immunologic: Negative.   Neurological: Negative.  Negative for dizziness and headaches.  Psychiatric/Behavioral: Negative.      Today's Vitals   10/07/21 1440  BP: (!) 146/90  Pulse: 96  Temp: 98.1 F  (36.7 C)  Weight: 256 lb (116.1 kg)  Height: 6' (1.829 m)  PainSc: 2    Body mass index is 34.72 kg/m.  Wt Readings from Last 3 Encounters:  10/07/21 256 lb (116.1 kg)  07/09/21 250 lb 6.4 oz (113.6 kg)  03/25/21 248 lb 12.8 oz (112.9 kg)    Objective:  Physical Exam Vitals reviewed.  Constitutional:      General: He is not in acute distress.    Appearance: Normal appearance. He is obese.  HENT:     Head: Normocephalic and atraumatic.     Right Ear: Tympanic membrane, ear canal and external ear normal. There is no impacted cerumen.     Left Ear: Tympanic membrane, ear canal and external ear normal. There is no impacted cerumen.     Nose:     Comments: Deferred - masked    Mouth/Throat:     Comments: Deferred - masked Eyes:     Extraocular Movements: Extraocular movements intact.  Conjunctiva/sclera: Conjunctivae normal.     Pupils: Pupils are equal, round, and reactive to light.  Cardiovascular:     Rate and Rhythm: Normal rate and regular rhythm.     Pulses: Normal pulses.     Heart sounds: Normal heart sounds. No murmur heard. Pulmonary:     Effort: Pulmonary effort is normal. No respiratory distress.     Breath sounds: Normal breath sounds. No wheezing.  Abdominal:     General: Abdomen is flat. Bowel sounds are normal. There is no distension.     Palpations: Abdomen is soft.     Tenderness: There is no abdominal tenderness.  Genitourinary:    Prostate: Normal.     Rectum: Guaiac result negative.  Musculoskeletal:        General: No swelling or tenderness. Normal range of motion.     Cervical back: Normal range of motion and neck supple. No tenderness.  Skin:    General: Skin is warm and dry.     Capillary Refill: Capillary refill takes less than 2 seconds.     Coloration: Skin is not jaundiced.  Neurological:     General: No focal deficit present.     Mental Status: He is alert and oriented to person, place, and time.     Cranial Nerves: No cranial nerve  deficit.     Motor: No weakness.  Psychiatric:        Mood and Affect: Mood normal.        Behavior: Behavior normal.        Thought Content: Thought content normal.        Judgment: Judgment normal.        Assessment And Plan:    1. Annual physical exam Behavior modifications discussed and diet history reviewed.   Pt will continue to exercise regularly and modify diet with low GI, plant based foods and decrease intake of processed foods.  Recommend intake of daily multivitamin, Vitamin D, and calcium.  Recommend colonoscopy (up to date) for preventive screenings, as well as recommend immunizations that include influenza, TDAP, and Shingles  2. Essential hypertension B/P is controlled.  CMP ordered to check renal function.  The importance of regular exercise and dietary modification was stressed to the patient.  EKG done with SR t abnormality - anterolateral ischemia - CMP14+EGFR - Lipid panel - CBC - PSA - POCT Urinalysis Dipstick (81002) - POCT UA - Microalbumin - EKG 12-Lead  3. Type 2 diabetes mellitus with other specified complication, with long-term current use of insulin (HCC) Chronic, fair control Continue with current medications Encouraged to limit intake of sugary foods and drinks Encouraged to increase physical activity to 150 minutes per week Diabetic foot exam done, decreased sensation bilateral fee Eye exam is up to date - Hemoglobin A1c - POCT Urinalysis Dipstick (81002) - POCT UA - Microalbumin  4. Class 1 obesity due to excess calories without serious comorbidity with body mass index (BMI) of 34.0 to 34.9 in adult He is encouraged to strive for BMI less than 30 to decrease cardiac risk. Advised to aim for at least 150 minutes of exercise per week.    Patient was given opportunity to ask questions. Patient verbalized understanding of the plan and was able to repeat key elements of the plan. All questions were answered to their satisfaction.   Stephen Brine, FNP   I, Stephen Brine, FNP, have reviewed all documentation for this visit. The documentation on 10/07/21 for the exam, diagnosis, procedures, and orders  are all accurate and complete.   THE PATIENT IS ENCOURAGED TO PRACTICE SOCIAL DISTANCING DUE TO THE COVID-19 PANDEMIC.

## 2021-10-07 NOTE — Patient Instructions (Signed)
Health Maintenance, Male Adopting a healthy lifestyle and getting preventive care are important in promoting health and wellness. Ask your health care provider about: The right schedule for you to have regular tests and exams. Things you can do on your own to prevent diseases and keep yourself healthy. What should I know about diet, weight, and exercise? Eat a healthy diet  Eat a diet that includes plenty of vegetables, fruits, low-fat dairy products, and lean protein. Do not eat a lot of foods that are high in solid fats, added sugars, or sodium. Maintain a healthy weight Body mass index (BMI) is a measurement that can be used to identify possible weight problems. It estimates body fat based on height and weight. Your health care provider can help determine your BMI and help you achieve or maintain a healthy weight. Get regular exercise Get regular exercise. This is one of the most important things you can do for your health. Most adults should: Exercise for at least 150 minutes each week. The exercise should increase your heart rate and make you sweat (moderate-intensity exercise). Do strengthening exercises at least twice a week. This is in addition to the moderate-intensity exercise. Spend less time sitting. Even light physical activity can be beneficial. Watch cholesterol and blood lipids Have your blood tested for lipids and cholesterol at 61 years of age, then have this test every 5 years. You may need to have your cholesterol levels checked more often if: Your lipid or cholesterol levels are high. You are older than 61 years of age. You are at high risk for heart disease. What should I know about cancer screening? Many types of cancers can be detected early and may often be prevented. Depending on your health history and family history, you may need to have cancer screening at various ages. This may include screening for: Colorectal cancer. Prostate cancer. Skin cancer. Lung  cancer. What should I know about heart disease, diabetes, and high blood pressure? Blood pressure and heart disease High blood pressure causes heart disease and increases the risk of stroke. This is more likely to develop in people who have high blood pressure readings, are of African descent, or are overweight. Talk with your health care provider about your target blood pressure readings. Have your blood pressure checked: Every 3-5 years if you are 18-39 years of age. Every year if you are 40 years old or older. If you are between the ages of 65 and 75 and are a current or former smoker, ask your health care provider if you should have a one-time screening for abdominal aortic aneurysm (AAA). Diabetes Have regular diabetes screenings. This checks your fasting blood sugar level. Have the screening done: Once every three years after age 45 if you are at a normal weight and have a low risk for diabetes. More often and at a younger age if you are overweight or have a high risk for diabetes. What should I know about preventing infection? Hepatitis B If you have a higher risk for hepatitis B, you should be screened for this virus. Talk with your health care provider to find out if you are at risk for hepatitis B infection. Hepatitis C Blood testing is recommended for: Everyone born from 1945 through 1965. Anyone with known risk factors for hepatitis C. Sexually transmitted infections (STIs) You should be screened each year for STIs, including gonorrhea and chlamydia, if: You are sexually active and are younger than 61 years of age. You are older than 61 years   of age and your health care provider tells you that you are at risk for this type of infection. Your sexual activity has changed since you were last screened, and you are at increased risk for chlamydia or gonorrhea. Ask your health care provider if you are at risk. Ask your health care provider about whether you are at high risk for HIV.  Your health care provider may recommend a prescription medicine to help prevent HIV infection. If you choose to take medicine to prevent HIV, you should first get tested for HIV. You should then be tested every 3 months for as long as you are taking the medicine. Follow these instructions at home: Lifestyle Do not use any products that contain nicotine or tobacco, such as cigarettes, e-cigarettes, and chewing tobacco. If you need help quitting, ask your health care provider. Do not use street drugs. Do not share needles. Ask your health care provider for help if you need support or information about quitting drugs. Alcohol use Do not drink alcohol if your health care provider tells you not to drink. If you drink alcohol: Limit how much you have to 0-2 drinks a day. Be aware of how much alcohol is in your drink. In the [place] one drink equals one 12 oz bottle of beer (355 mL), one 5 oz glass of wine (148 mL), or one 1 oz glass of hard liquor (44 mL). General instructions Schedule regular health, dental, and eye exams. Stay current with your vaccines. Tell your health care provider if: You often feel depressed. You have ever been abused or do not feel safe at home. Summary Adopting a healthy lifestyle and getting preventive care are important in promoting health and wellness. Follow your health care provider's instructions about healthy diet, exercising, and getting tested or screened for diseases. Follow your health care provider's instructions on monitoring your cholesterol and blood pressure. This information is not intended to replace advice given to you by your health care provider. Make sure you discuss any questions you have with your health care provider. Document Revised: 02/14/2021 Document Reviewed: 11/30/2018 Elsevier Patient Education  2022 Elsevier Inc.  

## 2021-10-08 LAB — CBC
Hematocrit: 41.5 % (ref 37.5–51.0)
Hemoglobin: 14.2 g/dL (ref 13.0–17.7)
MCH: 29.4 pg (ref 26.6–33.0)
MCHC: 34.2 g/dL (ref 31.5–35.7)
MCV: 86 fL (ref 79–97)
Platelets: 201 10*3/uL (ref 150–450)
RBC: 4.83 x10E6/uL (ref 4.14–5.80)
RDW: 12.7 % (ref 11.6–15.4)
WBC: 8.1 10*3/uL (ref 3.4–10.8)

## 2021-10-08 LAB — LIPID PANEL
Chol/HDL Ratio: 4.3 ratio (ref 0.0–5.0)
Cholesterol, Total: 108 mg/dL (ref 100–199)
HDL: 25 mg/dL — ABNORMAL LOW (ref >39)
LDL Chol Calc (NIH): 63 mg/dL (ref 0–99)
Triglycerides: 109 mg/dL (ref 0–149)
VLDL Cholesterol Cal: 20 mg/dL (ref 5–40)

## 2021-10-08 LAB — CMP14+EGFR
ALT: 20 IU/L (ref 0–44)
AST: 14 IU/L (ref 0–40)
Albumin/Globulin Ratio: 1.7 (ref 1.2–2.2)
Albumin: 4.3 g/dL (ref 3.8–4.8)
Alkaline Phosphatase: 75 IU/L (ref 44–121)
BUN/Creatinine Ratio: 13 (ref 10–24)
BUN: 13 mg/dL (ref 8–27)
Bilirubin Total: 0.3 mg/dL (ref 0.0–1.2)
CO2: 24 mmol/L (ref 20–29)
Calcium: 9.1 mg/dL (ref 8.6–10.2)
Chloride: 102 mmol/L (ref 96–106)
Creatinine, Ser: 1 mg/dL (ref 0.76–1.27)
Globulin, Total: 2.6 g/dL (ref 1.5–4.5)
Glucose: 113 mg/dL — ABNORMAL HIGH (ref 70–99)
Potassium: 4.4 mmol/L (ref 3.5–5.2)
Sodium: 140 mmol/L (ref 134–144)
Total Protein: 6.9 g/dL (ref 6.0–8.5)
eGFR: 86 mL/min/{1.73_m2} (ref >59)

## 2021-10-08 LAB — HEMOGLOBIN A1C
Est. average glucose Bld gHb Est-mCnc: 160 mg/dL
Hgb A1c MFr Bld: 7.2 % — ABNORMAL HIGH (ref 4.8–5.6)

## 2021-10-08 LAB — PSA: Prostate Specific Ag, Serum: 0.3 ng/mL (ref 0.0–4.0)

## 2021-10-21 ENCOUNTER — Other Ambulatory Visit: Payer: Self-pay | Admitting: Nurse Practitioner

## 2021-10-21 DIAGNOSIS — Z794 Long term (current) use of insulin: Secondary | ICD-10-CM

## 2021-10-21 DIAGNOSIS — E1169 Type 2 diabetes mellitus with other specified complication: Secondary | ICD-10-CM

## 2021-10-29 ENCOUNTER — Encounter: Payer: Self-pay | Admitting: Nurse Practitioner

## 2021-12-18 ENCOUNTER — Encounter: Payer: Self-pay | Admitting: Nurse Practitioner

## 2021-12-18 LAB — HM DIABETES EYE EXAM

## 2021-12-30 ENCOUNTER — Other Ambulatory Visit: Payer: Self-pay | Admitting: Nurse Practitioner

## 2022-01-07 ENCOUNTER — Other Ambulatory Visit: Payer: Self-pay

## 2022-01-07 ENCOUNTER — Encounter: Payer: Self-pay | Admitting: Podiatry

## 2022-01-07 ENCOUNTER — Ambulatory Visit (INDEPENDENT_AMBULATORY_CARE_PROVIDER_SITE_OTHER): Admitting: Podiatry

## 2022-01-07 DIAGNOSIS — B351 Tinea unguium: Secondary | ICD-10-CM | POA: Diagnosis not present

## 2022-01-07 DIAGNOSIS — E1165 Type 2 diabetes mellitus with hyperglycemia: Secondary | ICD-10-CM | POA: Diagnosis not present

## 2022-01-07 DIAGNOSIS — M79675 Pain in left toe(s): Secondary | ICD-10-CM

## 2022-01-07 DIAGNOSIS — Z794 Long term (current) use of insulin: Secondary | ICD-10-CM

## 2022-01-07 DIAGNOSIS — M79674 Pain in right toe(s): Secondary | ICD-10-CM

## 2022-01-07 NOTE — Progress Notes (Signed)
°  Subjective:  Patient ID: Stephen Sampson, male    DOB: 1961/07/28,  MRN: [id number]  61 y.o. male presents preventative diabetic foot care and painful elongated mycotic toenails 1-5 bilaterally which are tender when wearing enclosed shoe gear. Pain is relieved with periodic professional debridement.  Patient relates no new pedal problems on today's visit. He states his wife is in the hospital and he is going to visit her after his appointment here today.  PCP is [name], FNP , and last visit was 10/07/2021.  Allergies  Allergen Reactions   Metformin And Related Diarrhea    Review of Systems: Negative except as noted in the HPI.   Objective:  Vascular Examination: Vascular status intact b/l with palpable pedal pulses. CFT immediate b/l. No edema. No pain with calf compression b/l. Skin temperature gradient WNL b/l. Pedal hair sparse.  Neurological Examination: Sensation grossly intact b/l with 10 gram monofilament. Vibratory sensation intact b/l.   Dermatological Examination: Pedal skin with normal turgor, texture and tone b/l. Toenails 1-5 b/l thick, discolored, elongated with subungual debris and pain on dorsal palpation. No hyperkeratotic lesions noted b/l.   Musculoskeletal Examination: Muscle strength 5/5 to b/l LE. Hammertoe deformity noted 2-5 b/l. Patient ambulates independent of any assistive aids.  Radiographs: None  Last A1c:   Hemoglobin A1C Latest Ref Rng & Units 10/07/2021 07/09/2021 02/25/2021  HGBA1C 4.8 - 5.6 % 7.2(H) 7.9(H) 6.8(H)  Some recent data might be hidden   Assessment:   1. Pain due to onychomycosis of toenails of both feet   2. Controlled type 2 diabetes mellitus with hyperglycemia, with long-term current use of insulin ([place])    Plan:  -Continue foot and shoe inspections daily. Monitor blood glucose per PCP/Endocrinologist's recommendations.  -Toenails 1-5 bilaterally were debrided in length and girth with sterile nail nippers and dremel.  Pinpoint bleeding of L 2nd toe addressed with Lumicain Hemostatic Solution, cleansed with alcohol. triple antibiotic ointment applied. Patient instructed to remove band-aid on tomorrow. No further treatment required by patient. -Patient/POA to call should there be question/concern in the interim.  Return in about 3 months (around 04/07/2022).  Marzetta Board, DPM

## 2022-01-08 ENCOUNTER — Ambulatory Visit (INDEPENDENT_AMBULATORY_CARE_PROVIDER_SITE_OTHER): Admitting: Nurse Practitioner

## 2022-01-08 ENCOUNTER — Encounter: Payer: Self-pay | Admitting: Nurse Practitioner

## 2022-01-08 VITALS — BP 136/70 | HR 80 | Temp 98.0°F | Ht 72.0 in | Wt 255.0 lb

## 2022-01-08 DIAGNOSIS — E6609 Other obesity due to excess calories: Secondary | ICD-10-CM | POA: Diagnosis not present

## 2022-01-08 DIAGNOSIS — I10 Essential (primary) hypertension: Secondary | ICD-10-CM | POA: Diagnosis not present

## 2022-01-08 DIAGNOSIS — E782 Mixed hyperlipidemia: Secondary | ICD-10-CM

## 2022-01-08 DIAGNOSIS — E1169 Type 2 diabetes mellitus with other specified complication: Secondary | ICD-10-CM

## 2022-01-08 DIAGNOSIS — Z23 Encounter for immunization: Secondary | ICD-10-CM

## 2022-01-08 DIAGNOSIS — Z6834 Body mass index (BMI) 34.0-34.9, adult: Secondary | ICD-10-CM

## 2022-01-08 DIAGNOSIS — Z794 Long term (current) use of insulin: Secondary | ICD-10-CM

## 2022-01-08 NOTE — Progress Notes (Signed)
I,Tianna Badgett,acting as a Education administrator for [employer], FNP.,have documented all relevant documentation on the behalf of [name], FNP,as directed by  [name], FNP while in the presence of [name], [place].  This visit occurred during the SARS-CoV-2 public health emergency.  Safety protocols were in place, including screening questions prior to the visit, additional usage of staff PPE, and extensive cleaning of exam room while observing appropriate contact time as indicated for disinfecting solutions.  Subjective:     Patient ID: Stephen Sampson , male    DOB: 1961-08-31 , 61 y.o.   MRN: [id number]   Chief Complaint  Patient presents with   Hypertension    HPI  Patient here for a f/u on dm and htn. Blood sugars have increased over the holidays and stressing due to his wife is in the hospital with metastatic cancer.    Hypertension This is a chronic problem. The current episode started more than 1 year ago. The problem is uncontrolled. Pertinent negatives include no anxiety, blurred vision, chest pain, headaches or palpitations. There are no known risk factors for coronary artery disease. Past treatments include angiotensin blockers. There are no compliance problems.  There is no history of angina. There is no history of chronic renal disease.  Diabetes He presents for his follow-up diabetic visit. He has type 2 diabetes mellitus. His disease course has been improving. Pertinent negatives for hypoglycemia include no dizziness or headaches. Pertinent negatives for diabetes include no blurred vision, no chest pain and no fatigue. There are no hypoglycemic complications. Symptoms are stable. There are no diabetic complications. Risk factors for coronary artery disease include obesity and sedentary lifestyle. Current diabetic treatment includes oral agent (dual therapy). He is compliant with treatment all of the time. His weight is decreasing steadily. He is following a generally healthy  (trying to watch portions) diet. When asked about meal planning, he reported none. He has not had a previous visit with a dietitian. He participates in exercise intermittently. An ACE inhibitor/angiotensin II receptor blocker is being taken. He does not see a podiatrist.Eye exam is current.    Past Medical History:  Diagnosis Date   Diabetes mellitus without complication ([place])    Hypertension      History reviewed. No pertinent family history.   Current Outpatient Medications:    aspirin EC 81 MG tablet, Take 81 mg by mouth daily., Disp: , Rfl:    atorvastatin (LIPITOR) 40 MG tablet, TAKE 1 TABLET EVERY EVENING, Disp: 90 tablet, Rfl: 3   B-D UF III MINI PEN NEEDLES 31G X 5 MM MISC, USE AS DIRECTED, Disp: 100 each, Rfl: 3   clindamycin (CLEOCIN T) 1 % lotion, Apply  as directed to affected area twice a day, Disp: , Rfl:    doxycycline (VIBRAMYCIN) 50 MG capsule, Take 50 mg by mouth 2 (two) times daily., Disp: , Rfl:    glucose blood (FREESTYLE LITE) test strip, USE AS INSTRUCTED, Disp: 300 strip, Rfl: 3   HUMALOG KWIKPEN 100 UNIT/ML KwikPen, INJECT UNDER THE SKIN BEFORE MEALS AS PER INSULIN SLIDING SCALE PROTOCOL, Disp: 45 mL, Rfl: 3   hydrochlorothiazide (HYDRODIURIL) 12.5 MG tablet, TAKE ONE TABLET BY MOUTH DAILY, Disp: 90 tablet, Rfl: 1   LANTUS SOLOSTAR 100 UNIT/ML Solostar Pen, INJECT 50 UNITS UNDER THE SKIN AT BEDTIME, Disp: 135 mL, Rfl: 3   sitaGLIPtin-metformin (JANUMET) 50-1000 MG tablet, Take 1 tablet by mouth 2 (two) times daily., Disp: 180 tablet, Rfl: 3   temazepam (RESTORIL) 15 MG capsule,  TAKE ONE CAPSULE BY MOUTH EVERY NIGHT AT BEDTIME AS NEEDED FOR SLEEP, Disp: 30 capsule, Rfl: 5   TRULICITY 1.5 KK/9.3GH SOPN, INJECT 1.5 MG INTO THE SKIN ONCE A WEEK, Disp: 6 mL, Rfl: 3   valsartan (DIOVAN) 320 MG tablet, TAKE 1 TABLET DAILY, Disp: 90 tablet, Rfl: 3   Vitamin D, Ergocalciferol, (DRISDOL) 1.25 MG (50000 UNIT) CAPS capsule, TAKE 1 CAPSULE TWO TIMES A WEEK, Disp: 24 capsule, Rfl:  3   SITagliptin-metFORMIN HCl (JANUMET XR PO), , Disp: , Rfl:    Allergies  Allergen Reactions   Metformin And Related Diarrhea     Review of Systems  Constitutional: Negative.  Negative for fatigue.  Eyes:  Negative for blurred vision.  Respiratory: Negative.    Cardiovascular: Negative.  Negative for chest pain and palpitations.  Gastrointestinal: Negative.   Neurological: Negative.  Negative for dizziness and headaches.    Today's Vitals   01/08/22 0849  BP: 136/70  Pulse: 80  Temp: 98 F (36.7 C)  TempSrc: Oral  Weight: 255 lb (115.7 kg)  Height: 6' (1.829 m)   Body mass index is 34.58 kg/m.  Wt Readings from Last 3 Encounters:  01/08/22 255 lb (115.7 kg)  10/07/21 256 lb (116.1 kg)  07/09/21 250 lb 6.4 oz (113.6 kg)    Objective:  Physical Exam Vitals reviewed.  Constitutional:      General: He is not in acute distress.    Appearance: Normal appearance. He is obese.  Cardiovascular:     Rate and Rhythm: Normal rate and regular rhythm.     Pulses: Normal pulses.     Heart sounds: Murmur (grade 2/6) heard.  Pulmonary:     Effort: Pulmonary effort is normal. No respiratory distress.     Breath sounds: Normal breath sounds. No wheezing.  Abdominal:     General: Abdomen is flat. Bowel sounds are normal.     Palpations: Abdomen is soft.  Musculoskeletal:     Cervical back: Normal range of motion and neck supple.  Skin:    General: Skin is warm and dry.     Capillary Refill: Capillary refill takes less than 2 seconds.  Neurological:     General: No focal deficit present.     Mental Status: He is alert and oriented to person, place, and time.     Cranial Nerves: No cranial nerve deficit.     Sensory: No sensory deficit.     Motor: No weakness.  Psychiatric:        Mood and Affect: Mood normal.        Behavior: Behavior normal.        Thought Content: Thought content normal.        Judgment: Judgment normal.        Assessment And Plan:     1.  Essential hypertension Comments: Blood pressure is controlled, continue current medications - BMP8+EGFR  2. Type 2 diabetes mellitus with other specified complication, with long-term current use of insulin ([facility]) Comments: HgbA1c is improving, unfortunately he is under increased stress with his wife who has metastatic cancer. Encouraged to keep close monitor of BS - Hemoglobin A1c - BMP8+EGFR  3. Mixed hyperlipidemia Comments: Stable, continue current medications - Lipid panel - BMP8+EGFR  4. Class 1 obesity due to excess calories without serious comorbidity with body mass index (BMI) of 34.0 to 34.9 in adult He is encouraged to initially strive for BMI less than 30 to decrease cardiac risk. He is advised to exercise  no less than 150 minutes per week.   5. Encounter for immunization - Pneumococcal conjugate vaccine 20-valent (Prevnar 20)      Patient was given opportunity to ask questions. Patient verbalized understanding of the plan and was able to repeat key elements of the plan. All questions were answered to their satisfaction.  [name], FNP   I, [name], FNP, have reviewed all documentation for this visit. The documentation on 01/08/22 for the exam, diagnosis, procedures, and orders are all accurate and complete.   IF YOU HAVE BEEN REFERRED TO A SPECIALIST, IT MAY TAKE 1-2 WEEKS TO SCHEDULE/PROCESS THE REFERRAL. IF YOU HAVE NOT HEARD FROM US/SPECIALIST IN TWO WEEKS, PLEASE GIVE [place] A CALL AT [phone].   THE PATIENT IS ENCOURAGED TO PRACTICE SOCIAL DISTANCING DUE TO THE COVID-19 PANDEMIC.

## 2022-01-08 NOTE — Patient Instructions (Signed)

## 2022-01-09 LAB — LIPID PANEL
Chol/HDL Ratio: 4.9 ratio (ref 0.0–5.0)
Cholesterol, Total: 107 mg/dL (ref 100–199)
HDL: 22 mg/dL — ABNORMAL LOW (ref >39)
LDL Chol Calc (NIH): 63 mg/dL (ref 0–99)
Triglycerides: 122 mg/dL (ref 0–149)
VLDL Cholesterol Cal: 22 mg/dL (ref 5–40)

## 2022-01-09 LAB — BMP8+EGFR
BUN/Creatinine Ratio: 15 (ref 10–24)
BUN: 15 mg/dL (ref 8–27)
CO2: 23 mmol/L (ref 20–29)
Calcium: 9.4 mg/dL (ref 8.6–10.2)
Chloride: 100 mmol/L (ref 96–106)
Creatinine, Ser: 0.99 mg/dL (ref 0.76–1.27)
Glucose: 175 mg/dL — ABNORMAL HIGH (ref 70–99)
Potassium: 4.3 mmol/L (ref 3.5–5.2)
Sodium: 140 mmol/L (ref 134–144)
eGFR: 87 mL/min/{1.73_m2} (ref >59)

## 2022-01-09 LAB — HEMOGLOBIN A1C
Est. average glucose Bld gHb Est-mCnc: 177 mg/dL
Hgb A1c MFr Bld: 7.8 % — ABNORMAL HIGH (ref 4.8–5.6)

## 2022-02-12 ENCOUNTER — Other Ambulatory Visit: Payer: Self-pay | Admitting: Nurse Practitioner

## 2022-02-12 DIAGNOSIS — G4700 Insomnia, unspecified: Secondary | ICD-10-CM

## 2022-02-23 ENCOUNTER — Other Ambulatory Visit: Payer: Self-pay | Admitting: Nurse Practitioner

## 2022-02-23 DIAGNOSIS — Z794 Long term (current) use of insulin: Secondary | ICD-10-CM

## 2022-02-23 DIAGNOSIS — E1169 Type 2 diabetes mellitus with other specified complication: Secondary | ICD-10-CM

## 2022-02-26 ENCOUNTER — Other Ambulatory Visit: Payer: Self-pay

## 2022-02-26 DIAGNOSIS — I10 Essential (primary) hypertension: Secondary | ICD-10-CM

## 2022-02-26 MED ORDER — HYDROCHLOROTHIAZIDE 12.5 MG PO TABS: 12.5000 mg | ORAL_TABLET | Freq: Every day | ORAL | 1 refills | Status: DC

## 2022-04-08 ENCOUNTER — Ambulatory Visit: Admitting: Nurse Practitioner

## 2022-04-08 ENCOUNTER — Encounter: Payer: Self-pay | Admitting: Nurse Practitioner

## 2022-04-08 VITALS — BP 132/74 | HR 66 | Temp 97.8°F | Ht 72.0 in | Wt 251.0 lb

## 2022-04-08 DIAGNOSIS — I119 Hypertensive heart disease without heart failure: Secondary | ICD-10-CM | POA: Diagnosis not present

## 2022-04-08 DIAGNOSIS — R011 Cardiac murmur, unspecified: Secondary | ICD-10-CM | POA: Diagnosis not present

## 2022-04-08 DIAGNOSIS — E6609 Other obesity due to excess calories: Secondary | ICD-10-CM

## 2022-04-08 DIAGNOSIS — E782 Mixed hyperlipidemia: Secondary | ICD-10-CM

## 2022-04-08 DIAGNOSIS — Z8679 Personal history of other diseases of the circulatory system: Secondary | ICD-10-CM

## 2022-04-08 DIAGNOSIS — E1169 Type 2 diabetes mellitus with other specified complication: Secondary | ICD-10-CM

## 2022-04-08 DIAGNOSIS — Z6834 Body mass index (BMI) 34.0-34.9, adult: Secondary | ICD-10-CM

## 2022-04-08 DIAGNOSIS — E669 Obesity, unspecified: Secondary | ICD-10-CM

## 2022-04-08 NOTE — Patient Instructions (Signed)

## 2022-04-08 NOTE — Progress Notes (Signed)
?Industrial/product designer as a Education administrator for [employer], FNP.,have documented all relevant documentation on the behalf of [name], FNP,as directed by  [name], FNP while in the presence of [name], [place]. ? ?This visit occurred during the SARS-CoV-2 public health emergency.  Safety protocols were in place, including screening questions prior to the visit, additional usage of staff PPE, and extensive cleaning of exam room while observing appropriate contact time as indicated for disinfecting solutions. ? ?Subjective:  ?  ? Patient ID: Stephen Sampson , male    DOB: October 24, 1961 , 61 y.o.   MRN: [id number] ? ? ?Chief Complaint  ?Patient presents with  ? Diabetes  ? ? ?HPI ? ?Patient here for a f/u on dm and htn. His wife passed in February. He is having good and bad days feels like this is normal. He is still able to do activities. He is cooking at home more, he has been trying to cook and portion foods out.  ? ?Wt Readings from Last 3 Encounters: ?04/08/22 : 251 lb (113.9 kg) ?01/08/22 : 255 lb (115.7 kg) ?10/07/21 : 256 lb (116.1 kg) ? ? ? ?Diabetes ?He presents for his follow-up diabetic visit. He has type 2 diabetes mellitus. His disease course has been improving. Pertinent negatives for hypoglycemia include no dizziness or headaches. Pertinent negatives for diabetes include no blurred vision, no chest pain and no fatigue. There are no hypoglycemic complications. Symptoms are stable. There are no diabetic complications. Risk factors for coronary artery disease include obesity and sedentary lifestyle. Current diabetic treatment includes oral agent (dual therapy). He is compliant with treatment all of the time. His weight is decreasing steadily. He is following a generally healthy diet. When asked about meal planning, he reported none. He has not had a previous visit with a dietitian. He participates in exercise intermittently. (Blood sugar this am was 107) An ACE inhibitor/angiotensin II receptor  blocker is being taken. He does not see a podiatrist.Eye exam is current (12/18/2021 last appt).  ?Hypertension ?This is a chronic problem. The current episode started more than 1 year ago. The problem is uncontrolled. Pertinent negatives include no anxiety, blurred vision, chest pain, headaches or palpitations. There are no known risk factors for coronary artery disease. Past treatments include angiotensin blockers and alpha 1 blockers. There are no compliance problems.  There is no history of angina. There is no history of chronic renal disease.   ? ?Past Medical History:  ?Diagnosis Date  ? Diabetes mellitus without complication ([place])   ? Hypertension   ?  ? ?History reviewed. No pertinent family history. ? ? ?Current Outpatient Medications:  ?  aspirin EC 81 MG tablet, Take 81 mg by mouth daily., Disp: , Rfl:  ?  atorvastatin (LIPITOR) 40 MG tablet, TAKE 1 TABLET EVERY EVENING, Disp: 90 tablet, Rfl: 3 ?  B-D UF III MINI PEN NEEDLES 31G X 5 MM MISC, USE AS DIRECTED, Disp: 100 each, Rfl: 3 ?  clindamycin (CLEOCIN T) 1 % lotion, Apply  as directed to affected area twice a day, Disp: , Rfl:  ?  doxycycline (VIBRAMYCIN) 50 MG capsule, Take 50 mg by mouth 2 (two) times daily., Disp: , Rfl:  ?  glucose blood (FREESTYLE LITE) test strip, USE AS INSTRUCTED, Disp: 300 strip, Rfl: 3 ?  HUMALOG KWIKPEN 100 UNIT/ML KwikPen, INJECT UNDER THE SKIN BEFORE MEALS AS PER INSULIN SLIDING SCALE PROTOCOL, Disp: 45 mL, Rfl: 3 ?  hydrochlorothiazide (HYDRODIURIL) 12.5 MG tablet, Take 1 tablet (12.5 mg  total) by mouth daily., Disp: 90 tablet, Rfl: 1 ?  LANTUS SOLOSTAR 100 UNIT/ML Solostar Pen, INJECT 50 UNITS UNDER THE SKIN AT BEDTIME, Disp: 135 mL, Rfl: 3 ?  sitaGLIPtin-metformin (JANUMET) 50-1000 MG tablet, Take 1 tablet by mouth 2 (two) times daily., Disp: 180 tablet, Rfl: 3 ?  SITagliptin-metFORMIN HCl (JANUMET XR PO), , Disp: , Rfl:  ?  temazepam (RESTORIL) 15 MG capsule, TAKE ONE CAPSULE BY MOUTH EVERY NIGHT AT BEDTIME AS NEEDED  FOR SLEEP, Disp: 30 capsule, Rfl: 5 ?  TRULICITY 1.5 MG/5.0IB SOPN, INJECT 1.5 MG INTO THE SKIN ONCE A WEEK, Disp: 6 mL, Rfl: 3 ?  valsartan (DIOVAN) 320 MG tablet, TAKE 1 TABLET DAILY, Disp: 90 tablet, Rfl: 3 ?  Vitamin D, Ergocalciferol, (DRISDOL) 1.25 MG (50000 UNIT) CAPS capsule, TAKE 1 CAPSULE TWO TIMES A WEEK, Disp: 24 capsule, Rfl: 3  ? ?Allergies  ?Allergen Reactions  ? Metformin And Related Diarrhea  ?  ? ?Review of Systems  ?Constitutional: Negative.  Negative for fatigue.  ?Eyes:  Negative for blurred vision.  ?Respiratory: Negative.    ?Cardiovascular: Negative.  Negative for chest pain and palpitations.  ?Gastrointestinal: Negative.   ?Neurological: Negative.  Negative for dizziness and headaches.  ?Psychiatric/Behavioral: Negative.     ? ?Today's Vitals  ? 04/08/22 0837  ?BP: 132/74  ?Pulse: 66  ?Temp: 97.8 ?F (36.6 ?C)  ?TempSrc: Oral  ?Weight: 251 lb (113.9 kg)  ?Height: 6' (1.829 m)  ? ?Body mass index is 34.04 kg/m?.  ?Wt Readings from Last 3 Encounters:  ?04/08/22 251 lb (113.9 kg)  ?01/08/22 255 lb (115.7 kg)  ?10/07/21 256 lb (116.1 kg)  ? ? ?Objective:  ?Physical Exam ?Vitals reviewed.  ?Constitutional:   ?   General: He is not in acute distress. ?   Appearance: Normal appearance. He is obese.  ?Cardiovascular:  ?   Rate and Rhythm: Normal rate and regular rhythm.  ?   Pulses: Normal pulses.  ?   Heart sounds: Murmur (grade 2/6) heard.  ?Pulmonary:  ?   Effort: Pulmonary effort is normal. No respiratory distress.  ?   Breath sounds: Normal breath sounds. No wheezing.  ?Abdominal:  ?   General: Abdomen is flat. Bowel sounds are normal.  ?   Palpations: Abdomen is soft.  ?Musculoskeletal:  ?   Cervical back: Normal range of motion and neck supple.  ?Skin: ?   General: Skin is warm and dry.  ?   Capillary Refill: Capillary refill takes less than 2 seconds.  ?Neurological:  ?   General: No focal deficit present.  ?   Mental Status: He is alert and oriented to person, place, and time.  ?   Cranial  Nerves: No cranial nerve deficit.  ?   Sensory: No sensory deficit.  ?   Motor: No weakness.  ?Psychiatric:     ?   Mood and Affect: Mood normal.     ?   Behavior: Behavior normal.     ?   Thought Content: Thought content normal.     ?   Judgment: Judgment normal.  ?  ? ?   ?Assessment And Plan:  ?   ?1. Hypertensive heart disease without heart failure ?Comments: Blood pressure is well controlled, continue current medications ?- Renal function panel with eGFR ? ?2. Diabetes mellitus type 2 in obese [facility]) ?Comments: HgbA1c increased at last visit, hopes this will improve since he has lost a little bit of weight  ?- Hemoglobin A1c ? ?  3. Mixed hyperlipidemia ?Comments: Stable, continue statin. Tolerating well.  ?- Lipid panel ? ?4. Class 1 obesity due to excess calories without serious comorbidity with body mass index (BMI) of 34.0 to 34.9 in adult ?He is encouraged to initially strive for BMI less than 30 to decrease cardiac risk. He is advised to exercise no less than 150 minutes per week.  ? ?5. History of aortic stenosis ?Comments: This is noted in the chart with the diagnosis and has a murmur, has not seen Cardiology in several years.  ?- Ambulatory referral to Cardiology ? ?6. Murmur, cardiac ?Comments: 2/6 murmur noted, this is not a new finding ?- Ambulatory referral to Cardiology ? ?He reports he had his shingrix done at the pharmacy we will call to check ? ?Patient was given opportunity to ask questions. Patient verbalized understanding of the plan and was able to repeat key elements of the plan. All questions were answered to their satisfaction.  ?[name], FNP  ? ?I, [name], FNP, have reviewed all documentation for this visit. The documentation on 04/08/22 for the exam, diagnosis, procedures, and orders are all accurate and complete.  ? ?IF YOU HAVE BEEN REFERRED TO A SPECIALIST, IT MAY TAKE 1-2 WEEKS TO SCHEDULE/PROCESS THE REFERRAL. IF YOU HAVE NOT HEARD FROM US/SPECIALIST IN TWO WEEKS, PLEASE  GIVE [place] A CALL AT [phone].  ? ?THE PATIENT IS ENCOURAGED TO PRACTICE SOCIAL DISTANCING DUE TO THE COVID-19 PANDEMIC.   ?

## 2022-04-09 ENCOUNTER — Encounter: Payer: Self-pay | Admitting: Cardiology

## 2022-04-09 ENCOUNTER — Ambulatory Visit (INDEPENDENT_AMBULATORY_CARE_PROVIDER_SITE_OTHER): Admitting: Cardiology

## 2022-04-09 VITALS — BP 154/88 | HR 66 | Ht 72.0 in | Wt 250.6 lb

## 2022-04-09 DIAGNOSIS — I350 Nonrheumatic aortic (valve) stenosis: Secondary | ICD-10-CM | POA: Diagnosis not present

## 2022-04-09 LAB — RENAL FUNCTION PANEL
Albumin: 4.4 g/dL (ref 3.8–4.8)
BUN/Creatinine Ratio: 11 (ref 10–24)
BUN: 11 mg/dL (ref 8–27)
CO2: 23 mmol/L (ref 20–29)
Calcium: 9.4 mg/dL (ref 8.6–10.2)
Chloride: 103 mmol/L (ref 96–106)
Creatinine, Ser: 1.01 mg/dL (ref 0.76–1.27)
Glucose: 118 mg/dL — ABNORMAL HIGH (ref 70–99)
Phosphorus: 3.3 mg/dL (ref 2.8–4.1)
Potassium: 4.8 mmol/L (ref 3.5–5.2)
Sodium: 140 mmol/L (ref 134–144)
eGFR: 85 mL/min/{1.73_m2} (ref >59)

## 2022-04-09 LAB — HEMOGLOBIN A1C
Est. average glucose Bld gHb Est-mCnc: 157 mg/dL
Hgb A1c MFr Bld: 7.1 % — ABNORMAL HIGH (ref 4.8–5.6)

## 2022-04-09 NOTE — Patient Instructions (Signed)
Medication Instructions:  ?No changes ?*If you need a refill on your cardiac medications before your next appointment, please call your pharmacy* ? ? ?Lab Work: ?None ordered ?If you have labs (blood work) drawn today and your tests are completely normal, you will receive your results only by: ?MyChart Message (if you have MyChart) OR ?A paper copy in the mail ?If you have any lab test that is abnormal or we need to change your treatment, we will call you to review the results. ? ? ?Testing/Procedures: ?Your physician has requested that you have an echocardiogram. Echocardiography is a painless test that uses sound waves to create images of your heart. It provides your doctor with information about the size and shape of your heart and how well your heart?s chambers and valves are working. You may receive an ultrasound enhancing agent through an IV if needed to better visualize your heart during the echo.This procedure takes approximately one hour. There are no restrictions for this procedure. This will take place at the [address].  ? ? ? ?Follow-Up: ?At [facility], you and your health needs are our priority.  As part of our continuing mission to provide you with exceptional heart care, we have created designated Provider Care Teams.  These Care Teams include your primary Cardiologist (physician) and Advanced Practice Providers (APPs -  Physician Assistants and Nurse Practitioners) who all work together to provide you with the care you need, when you need it. ? ?We recommend signing up for the patient portal called "MyChart".  Sign up information is provided on this After Visit Summary.  MyChart is used to connect with patients for Virtual Visits (Telemedicine).  Patients are able to view lab/test results, encounter notes, upcoming appointments, etc.  Non-urgent messages can be sent to your provider as well.   ?To learn more about what you can do with MyChart, go to [email or web address].    ? ?Your next appointment:   ?12 month(s) ? ?The format for your next appointment:   ?In Person ? ?Provider:   ?Dr. Percival Spanish ? ?Important Information About Sugar ? ? ? ? ? ? ?

## 2022-04-09 NOTE — Progress Notes (Signed)
?  ?Cardiology Office Note ? ? ?Date:  04/09/2022  ? ?ID:  Stephen Sampson, DOB 22-Nov-1961, MRN [id number] ? ?PCP:  [name], FNP  ?Cardiologist:   Minus Breeding, MD ?Referring:  [name], FNP ? ?Chief Complaint  ?Patient presents with  ? Aortic Stenosis  ? ? ?  ?History of Present Illness: ?Stephen Sampson is a 61 y.o. male who presents for evaluation of aortic stenosis.  He has a history of aortic stenosis.  Echo in March 2020 demonstrated moderate concentric left ventricular hypertrophy.  There was mild aortic valve stenosis.  I was able to find a cardiac cath from 2005.  The EF was 60%.  There was no evidence of coronary artery disease. ? ?It has been a few years since he saw anybody.  He is new to this practice.  He has never had any problems with this.  Has had a markedly abnormal EKG with T wave inversions as described below.  He has had no shortness of breath, PND or orthopnea.  He has no chest pressure, neck or arm discomfort.  He has had no weight gain or edema.  Pushes a self-propelled mower and has no symptoms with this. ? ?His wife of greater than 30 years died recently and he is appropriately grieving. ? ? ?Past Medical History:  ?Diagnosis Date  ? Diabetes mellitus without complication ([place])   ? Hypertension   ? ? ?Past Surgical History:  ?Procedure Laterality Date  ? CARDIAC CATHETERIZATION    ? ? ? ?Current Outpatient Medications  ?Medication Sig Dispense Refill  ? aspirin EC 81 MG tablet Take 81 mg by mouth daily.    ? atorvastatin (LIPITOR) 40 MG tablet TAKE 1 TABLET EVERY EVENING 90 tablet 3  ? B-D UF III MINI PEN NEEDLES 31G X 5 MM MISC USE AS DIRECTED 100 each 3  ? clindamycin (CLEOCIN T) 1 % lotion Apply  as directed to affected area twice a day    ? doxycycline (VIBRAMYCIN) 50 MG capsule Take 50 mg by mouth 2 (two) times daily.    ? glucose blood (FREESTYLE LITE) test strip USE AS INSTRUCTED 300 strip 3  ? HUMALOG KWIKPEN 100 UNIT/ML KwikPen INJECT UNDER THE SKIN BEFORE  MEALS AS PER INSULIN SLIDING SCALE PROTOCOL 45 mL 3  ? hydrochlorothiazide (HYDRODIURIL) 12.5 MG tablet Take 1 tablet (12.5 mg total) by mouth daily. 90 tablet 1  ? LANTUS SOLOSTAR 100 UNIT/ML Solostar Pen INJECT 50 UNITS UNDER THE SKIN AT BEDTIME 135 mL 3  ? sitaGLIPtin-metformin (JANUMET) 50-1000 MG tablet Take 1 tablet by mouth 2 (two) times daily. 180 tablet 3  ? SITagliptin-metFORMIN HCl (JANUMET XR PO)     ? temazepam (RESTORIL) 15 MG capsule TAKE ONE CAPSULE BY MOUTH EVERY NIGHT AT BEDTIME AS NEEDED FOR SLEEP 30 capsule 5  ? TRULICITY 1.5 QM/0.8QP SOPN INJECT 1.5 MG INTO THE SKIN ONCE A WEEK 6 mL 3  ? valsartan (DIOVAN) 320 MG tablet TAKE 1 TABLET DAILY 90 tablet 3  ? Vitamin D, Ergocalciferol, (DRISDOL) 1.25 MG (50000 UNIT) CAPS capsule TAKE 1 CAPSULE TWO TIMES A WEEK 24 capsule 3  ? ?No current facility-administered medications for this visit.  ? ? ?Allergies:   Metformin and related  ? ? ?Social History:  The patient  reports that he has been smoking cigarettes. He has a 43.00 pack-year smoking history. He uses smokeless tobacco. He reports current alcohol use. He reports that he does not use drugs.  ? ?Family History:  The  patient's family history includes Dementia in his father; Sudden death in his mother.  ? ? ?ROS:  Please see the history of present illness.   Otherwise, review of systems are positive for none.   All other systems are reviewed and negative.  ? ? ?PHYSICAL EXAM: ?VS:  BP (!) 154/88   Pulse 66   Ht 6' (1.829 m)   Wt 250 lb 9.6 oz (113.7 kg)   SpO2 97%   BMI 33.99 kg/m?  , BMI Body mass index is 33.99 kg/m?. ?GENERAL:  Well appearing ?HEENT:  Pupils equal round and reactive, fundi not visualized, oral mucosa unremarkable ?NECK:  No jugular venous distention, waveform within normal limits, carotid upstroke brisk and symmetric, no bruits, no thyromegaly ?LYMPHATICS:  No cervical, inguinal adenopathy ?LUNGS:  Clear to auscultation bilaterally ?BACK:  No CVA tenderness ?CHEST:   Unremarkable ?HEART:  PMI not displaced or sustained,S1 and S2 within normal limits, no S3, no S4, no clicks, no rubs, 3 out of 6 apical systolic murmur radiating slightly at aortic outflow tract and into the carotids, no diastolic murmurs ?ABD:  Flat, positive bowel sounds normal in frequency in pitch, no bruits, no rebound, no guarding, no midline pulsatile mass, no hepatomegaly, no splenomegaly ?EXT:  2 plus pulses throughout, no edema, no cyanosis no clubbing ?SKIN:  No rashes no nodules ?NEURO:  Cranial nerves II through XII grossly intact, motor grossly intact throughout ?PSYCH:  Cognitively intact, oriented to person place and time ? ? ? ?EKG:  EKG is ordered today. ?The ekg ordered today demonstrates sinus rhythm, rate 66, heart intervals within normal limits, deep anterolateral T wave inversions unchanged from previous. ? ? ?Recent Labs: ?10/07/2021: ALT 20; Hemoglobin 14.2; Platelets 201 ?04/08/2022: BUN 11; Creatinine, Ser 1.01; Potassium 4.8; Sodium 140  ? ? ?Lipid Panel ?   ?Component Value Date/Time  ? CHOL 107 01/08/2022 0917  ? TRIG 122 01/08/2022 0917  ? HDL 22 (L) 01/08/2022 0917  ? CHOLHDL 4.9 01/08/2022 0917  ? [place] 63 01/08/2022 0917  ? ?  ? ?Wt Readings from Last 3 Encounters:  ?04/09/22 250 lb 9.6 oz (113.7 kg)  ?04/08/22 251 lb (113.9 kg)  ?01/08/22 255 lb (115.7 kg)  ?  ? ? ?Other studies Reviewed: ?Additional studies/ records that were reviewed today include: Labs. ?Review of the above records demonstrates:  Please see elsewhere in the note.   ? ? ?ASSESSMENT AND PLAN: ? ?Aortic stenosis: I will be checking an echocardiogram.  At this point he has no symptoms.  I probably will follow this clinically and with repeat echocardiograms. ? ?LVH: I will have a low threshold to consider further testing such as PYP or MRI based on the echo. ? ?HTN: This may well be the cause of the LVH.  It is a little bit high today.  He is going to keep a blood pressure diary. ? ?DM: A1c was down to 7.1.   Management per his primary provider. ? ?Tobacco abuse: We had a discussion about this.  Chantix did not work.  He is going to call [facility] [place]. ? ? ?Current medicines are reviewed at length with the patient today.  The patient does not have concerns regarding medicines. ? ?The following changes have been made:  no change ? ?Labs/ tests ordered today include:  ? ?Orders Placed This Encounter  ?Procedures  ? EKG 12-Lead  ? ECHOCARDIOGRAM COMPLETE  ? ? ? ?Disposition:   FU with me in one year.  ? ? ?  Signed, ?Minus Breeding, MD  ?04/09/2022 3:56 PM    ?[place] ? ? ? ?

## 2022-04-10 ENCOUNTER — Ambulatory Visit (INDEPENDENT_AMBULATORY_CARE_PROVIDER_SITE_OTHER): Admitting: Podiatry

## 2022-04-10 ENCOUNTER — Encounter: Payer: Self-pay | Admitting: Podiatry

## 2022-04-10 DIAGNOSIS — B351 Tinea unguium: Secondary | ICD-10-CM | POA: Diagnosis not present

## 2022-04-10 DIAGNOSIS — M79675 Pain in left toe(s): Secondary | ICD-10-CM

## 2022-04-10 DIAGNOSIS — M79674 Pain in right toe(s): Secondary | ICD-10-CM

## 2022-04-15 LAB — LIPID PANEL
Chol/HDL Ratio: 3.7 ratio (ref 0.0–5.0)
Cholesterol, Total: 99 mg/dL — ABNORMAL LOW (ref 100–199)
HDL: 27 mg/dL — ABNORMAL LOW (ref >39)
LDL Chol Calc (NIH): 54 mg/dL (ref 0–99)
Triglycerides: 93 mg/dL (ref 0–149)
VLDL Cholesterol Cal: 18 mg/dL (ref 5–40)

## 2022-04-15 LAB — SPECIMEN STATUS REPORT

## 2022-04-19 ENCOUNTER — Other Ambulatory Visit: Payer: Self-pay | Admitting: Nurse Practitioner

## 2022-04-19 NOTE — Progress Notes (Signed)
?  Subjective:  ?Patient ID: Stephen Sampson, male    DOB: May 25, 1961,  MRN: [id number] ? ?61 y.o. male presents preventative diabetic foot care and painful elongated mycotic toenails 1-5 bilaterally which are tender when wearing enclosed shoe gear. Pain is relieved with periodic professional debridement. ? ?Patient relates no new pedal problems on today's visit.  ? ?Sadly, Stephen Sampson lost his wife to cancer since his last visit here. ? ?PCP is [name], FNP , and last visit was 04/08/2022 ? ?Allergies  ?Allergen Reactions  ? Metformin And Related Diarrhea  ? ? ?Review of Systems: Negative except as noted in the HPI.  ? ?Objective:  ?Vascular Examination: ?Vascular status intact b/l with palpable pedal pulses. CFT immediate b/l. No edema. No pain with calf compression b/l. Skin temperature gradient WNL b/l. Pedal hair sparse. ? ?Neurological Examination: ?Sensation grossly intact b/l with 10 gram monofilament. Vibratory sensation intact b/l.  ? ?Dermatological Examination: ?Pedal skin with normal turgor, texture and tone b/l. Toenails 1-5 b/l thick, discolored, elongated with subungual debris and pain on dorsal palpation. No hyperkeratotic lesions noted b/l. Minimal callus formation submet head 5 bilaterally. ? ?Musculoskeletal Examination: ?Muscle strength 5/5 to b/l LE. Hammertoe deformity noted 2-5 b/l. Patient ambulates independent of any assistive aids. ? ?Radiographs: None ? ?Last A1c:   ? ?  Latest Ref Rng & Units 04/08/2022  ?  9:05 AM 01/08/2022  ?  9:17 AM 10/07/2021  ?  3:45 PM 07/09/2021  ?  9:09 AM  ?Hemoglobin A1C  ?Hemoglobin-A1c 4.8 - 5.6 % 7.1   7.8   7.2   7.9    ? ?Assessment:  ? ?1. Pain due to onychomycosis of toenails of both feet   ? ?Plan:  ?-Patient was evaluated and treated. All patient's and/or POA's questions/concerns answered on today's visit. ?-Continue foot and shoe inspections daily. Monitor blood glucose per PCP/Endocrinologist's recommendations. ?-Patient to continue soft,  supportive shoe gear daily. ?-Mycotic toenails 1-5 bilaterally were debrided in length and girth with sterile nail nippers and dremel without incident.  ?-Patient/POA to call should there be question/concern in the interim. ? ?Return in about 3 months (around 07/10/2022). ? ?Marzetta Board, DPM ?

## 2022-04-21 ENCOUNTER — Ambulatory Visit (HOSPITAL_COMMUNITY): Attending: Internal Medicine

## 2022-04-21 DIAGNOSIS — I350 Nonrheumatic aortic (valve) stenosis: Secondary | ICD-10-CM | POA: Diagnosis not present

## 2022-04-21 LAB — ECHOCARDIOGRAM COMPLETE
AR max vel: 0.84 cm2
AV Area VTI: 1 cm2
AV Area mean vel: 0.97 cm2
AV Mean grad: 27 mmHg
AV Peak grad: 47.9 mmHg
Ao pk vel: 3.46 m/s
Area-P 1/2: 3.42 cm2
S' Lateral: 3.1 cm

## 2022-04-24 ENCOUNTER — Telehealth: Payer: Self-pay | Admitting: Cardiology

## 2022-04-24 ENCOUNTER — Other Ambulatory Visit: Payer: Self-pay

## 2022-04-24 DIAGNOSIS — I350 Nonrheumatic aortic (valve) stenosis: Secondary | ICD-10-CM

## 2022-04-24 NOTE — Telephone Encounter (Signed)
Called patient, gave ECHO results.  ?Patient verbalized understanding. ? ?ECHO ordered for 1 year out-  ?Thanks! ?

## 2022-04-24 NOTE — Telephone Encounter (Signed)
Pt returning nurses call regarding ECHO results. Please advise 

## 2022-07-01 ENCOUNTER — Other Ambulatory Visit: Payer: Self-pay

## 2022-07-01 ENCOUNTER — Encounter: Payer: Self-pay | Admitting: Nurse Practitioner

## 2022-07-01 MED ORDER — FREESTYLE LITE TEST VI STRP
ORAL_STRIP | 3 refills | Status: DC
Start: 2022-07-01 — End: 2024-04-19

## 2022-07-16 ENCOUNTER — Ambulatory Visit: Admitting: Nurse Practitioner

## 2022-07-16 ENCOUNTER — Encounter: Payer: Self-pay | Admitting: Nurse Practitioner

## 2022-07-16 VITALS — BP 158/76 | HR 71 | Temp 98.1°F | Ht 72.0 in | Wt 258.4 lb

## 2022-07-16 DIAGNOSIS — F329 Major depressive disorder, single episode, unspecified: Secondary | ICD-10-CM | POA: Diagnosis not present

## 2022-07-16 DIAGNOSIS — E669 Obesity, unspecified: Secondary | ICD-10-CM

## 2022-07-16 DIAGNOSIS — E1169 Type 2 diabetes mellitus with other specified complication: Secondary | ICD-10-CM | POA: Diagnosis not present

## 2022-07-16 DIAGNOSIS — I119 Hypertensive heart disease without heart failure: Secondary | ICD-10-CM

## 2022-07-16 DIAGNOSIS — E782 Mixed hyperlipidemia: Secondary | ICD-10-CM | POA: Diagnosis not present

## 2022-07-16 DIAGNOSIS — Z720 Tobacco use: Secondary | ICD-10-CM

## 2022-07-16 DIAGNOSIS — Z6835 Body mass index (BMI) 35.0-35.9, adult: Secondary | ICD-10-CM

## 2022-07-16 DIAGNOSIS — F4321 Adjustment disorder with depressed mood: Secondary | ICD-10-CM

## 2022-07-16 DIAGNOSIS — Z8679 Personal history of other diseases of the circulatory system: Secondary | ICD-10-CM

## 2022-07-16 NOTE — Patient Instructions (Signed)
Hypertension, Adult High blood pressure (hypertension) is when the force of blood pumping through the arteries is too strong. The arteries are the blood vessels that carry blood from the heart throughout the body. Hypertension forces the heart to work harder to pump blood and may cause arteries to become narrow or stiff. Untreated or uncontrolled hypertension can lead to a heart attack, heart failure, a stroke, kidney disease, and other problems. A blood pressure reading consists of a higher number over a lower number. Ideally, your blood pressure should be below 120/80. The first ("top") number is called the systolic pressure. It is a measure of the pressure in your arteries as your heart beats. The second ("bottom") number is called the diastolic pressure. It is a measure of the pressure in your arteries as the heart relaxes. What are the causes? The exact cause of this condition is not known. There are some conditions that result in high blood pressure. What increases the risk? Certain factors may make you more likely to develop high blood pressure. Some of these risk factors are under your control, including: Smoking. Not getting enough exercise or physical activity. Being overweight. Having too much fat, sugar, calories, or salt (sodium) in your diet. Drinking too much alcohol. Other risk factors include: Having a personal history of heart disease, diabetes, high cholesterol, or kidney disease. Stress. Having a family history of high blood pressure and high cholesterol. Having obstructive sleep apnea. Age. The risk increases with age. What are the signs or symptoms? High blood pressure may not cause symptoms. Very high blood pressure (hypertensive crisis) may cause: Headache. Fast or irregular heartbeats (palpitations). Shortness of breath. Nosebleed. Nausea and vomiting. Vision changes. Severe chest pain, dizziness, and seizures. How is this diagnosed? This condition is diagnosed by  measuring your blood pressure while you are seated, with your arm resting on a flat surface, your legs uncrossed, and your feet flat on the floor. The cuff of the blood pressure monitor will be placed directly against the skin of your upper arm at the level of your heart. Blood pressure should be measured at least twice using the same arm. Certain conditions can cause a difference in blood pressure between your right and left arms. If you have a high blood pressure reading during one visit or you have normal blood pressure with other risk factors, you may be asked to: Return on a different day to have your blood pressure checked again. Monitor your blood pressure at home for 1 week or longer. If you are diagnosed with hypertension, you may have other blood or imaging tests to help your health care provider understand your overall risk for other conditions. How is this treated? This condition is treated by making healthy lifestyle changes, such as eating healthy foods, exercising more, and reducing your alcohol intake. You may be referred for counseling on a healthy diet and physical activity. Your health care provider may prescribe medicine if lifestyle changes are not enough to get your blood pressure under control and if: Your systolic blood pressure is above 130. Your diastolic blood pressure is above 80. Your personal target blood pressure may vary depending on your medical conditions, your age, and other factors. Follow these instructions at home: Eating and drinking  Eat a diet that is high in fiber and potassium, and low in sodium, added sugar, and fat. An example of this eating plan is called the DASH diet. DASH stands for Dietary Approaches to Stop Hypertension. To eat this way: Eat   plenty of fresh fruits and vegetables. Try to fill one half of your plate at each meal with fruits and vegetables. Eat whole grains, such as whole-wheat pasta, brown rice, or whole-grain bread. Fill about one  fourth of your plate with whole grains. Eat or drink low-fat dairy products, such as skim milk or low-fat yogurt. Avoid fatty cuts of meat, processed or cured meats, and poultry with skin. Fill about one fourth of your plate with lean proteins, such as fish, chicken without skin, beans, eggs, or tofu. Avoid pre-made and processed foods. These tend to be higher in sodium, added sugar, and fat. Reduce your daily sodium intake. Many people with hypertension should eat less than 1,500 mg of sodium a day. Do not drink alcohol if: Your health care provider tells you not to drink. You are pregnant, may be pregnant, or are planning to become pregnant. If you drink alcohol: Limit how much you have to: 0-1 drink a day for women. 0-2 drinks a day for men. Know how much alcohol is in your drink. In the [place] one drink equals one 12 oz bottle of beer (355 mL), one 5 oz glass of wine (148 mL), or one 1 oz glass of hard liquor (44 mL). Lifestyle  Work with your health care provider to maintain a healthy body weight or to lose weight. Ask what an ideal weight is for you. Get at least 30 minutes of exercise that causes your heart to beat faster (aerobic exercise) most days of the week. Activities may include walking, swimming, or biking. Include exercise to strengthen your muscles (resistance exercise), such as Pilates or lifting weights, as part of your weekly exercise routine. Try to do these types of exercises for 30 minutes at least 3 days a week. Do not use any products that contain nicotine or tobacco. These products include cigarettes, chewing tobacco, and vaping devices, such as e-cigarettes. If you need help quitting, ask your health care provider. Monitor your blood pressure at home as told by your health care provider. Keep all follow-up visits. This is important. Medicines Take over-the-counter and prescription medicines only as told by your health care provider. Follow directions carefully. Blood  pressure medicines must be taken as prescribed. Do not skip doses of blood pressure medicine. Doing this puts you at risk for problems and can make the medicine less effective. Ask your health care provider about side effects or reactions to medicines that you should watch for. Contact a health care provider if you: Think you are having a reaction to a medicine you are taking. Have headaches that keep coming back (recurring). Feel dizzy. Have swelling in your ankles. Have trouble with your vision. Get help right away if you: Develop a severe headache or confusion. Have unusual weakness or numbness. Feel faint. Have severe pain in your chest or abdomen. Vomit repeatedly. Have trouble breathing. These symptoms may be an emergency. Get help right away. Call 911. Do not wait to see if the symptoms will go away. Do not drive yourself to the hospital. Summary Hypertension is when the force of blood pumping through your arteries is too strong. If this condition is not controlled, it may put you at risk for serious complications. Your personal target blood pressure may vary depending on your medical conditions, your age, and other factors. For most people, a normal blood pressure is less than 120/80. Hypertension is treated with lifestyle changes, medicines, or a combination of both. Lifestyle changes include losing weight, eating a healthy,   low-sodium diet, exercising more, and limiting alcohol. This information is not intended to replace advice given to you by your health care provider. Make sure you discuss any questions you have with your health care provider. Document Revised: 10/14/2021 Document Reviewed: 10/14/2021 Elsevier Patient Education  2023 [employer]  

## 2022-07-16 NOTE — Progress Notes (Signed)
I,Tianna Badgett,acting as a Education administrator for [employer], FNP.,have documented all relevant documentation on the behalf of [name], FNP,as directed by  [name], FNP while in the presence of [name], Caroline.  Subjective:     Patient ID: Stephen Sampson , male    DOB: November 01, 1961 , 61 y.o.   MRN: [id number]   Chief Complaint  Patient presents with   Hypertension    HPI  Patient here for a f/u on dm and htn. Patient has no other issues. Patient states he has been having some depression.  BP Readings from Last 3 Encounters: 07/16/22 : (!) 160/78 04/09/22 : (!) 154/88 04/08/22 : 132/74   Wt Readings from Last 3 Encounters: 07/16/22 : 258 lb 6.4 oz (117.2 kg) 04/09/22 : 250 lb 9.6 oz (113.7 kg) 04/08/22 : 251 lb (113.9 kg)    Hypertension This is a chronic problem. The current episode started more than 1 year ago. The problem is uncontrolled. Pertinent negatives include no anxiety, blurred vision, chest pain, headaches or palpitations. There are no known risk factors for coronary artery disease. Past treatments include angiotensin blockers and alpha 1 blockers. There are no compliance problems.  There is no history of angina. There is no history of chronic renal disease.  Diabetes He presents for his follow-up diabetic visit. He has type 2 diabetes mellitus. His disease course has been improving. Pertinent negatives for hypoglycemia include no dizziness or headaches. Pertinent negatives for diabetes include no blurred vision, no chest pain and no fatigue. There are no hypoglycemic complications. Symptoms are stable. There are no diabetic complications. Risk factors for coronary artery disease include obesity and sedentary lifestyle. Current diabetic treatment includes oral agent (dual therapy). He is compliant with treatment all of the time. His weight is increasing steadily. He is following a generally healthy diet. When asked about meal planning, he reported none. He has not had  a previous visit with a dietitian. He participates in exercise intermittently. (Blood sugars have been higher than he would like was 189 this morning) An ACE inhibitor/angiotensin II receptor blocker is being taken. He does not see a podiatrist.Eye exam is current (12/18/2021 last appt).     Past Medical History:  Diagnosis Date   Diabetes mellitus without complication ([place])    Hypertension      Family History  Problem Relation Age of Onset   Sudden death Mother        Possible MI   Dementia Father      Current Outpatient Medications:    aspirin EC 81 MG tablet, Take 81 mg by mouth daily., Disp: , Rfl:    atorvastatin (LIPITOR) 40 MG tablet, TAKE 1 TABLET EVERY EVENING, Disp: 90 tablet, Rfl: 3   B-D UF III MINI PEN NEEDLES 31G X 5 MM MISC, USE AS DIRECTED, Disp: 100 each, Rfl: 3   clindamycin (CLEOCIN T) 1 % lotion, Apply  as directed to affected area twice a day, Disp: , Rfl:    doxycycline (VIBRAMYCIN) 50 MG capsule, Take 50 mg by mouth 2 (two) times daily., Disp: , Rfl:    glucose blood (FREESTYLE LITE) test strip, USE AS INSTRUCTED, Disp: 300 strip, Rfl: 3   HUMALOG KWIKPEN 100 UNIT/ML KwikPen, INJECT UNDER THE SKIN BEFORE MEALS AS PER INSULIN SLIDING SCALE PROTOCOL, Disp: 45 mL, Rfl: 3   hydrochlorothiazide (HYDRODIURIL) 12.5 MG tablet, Take 1 tablet (12.5 mg total) by mouth daily., Disp: 90 tablet, Rfl: 1   JANUMET 50-1000 MG tablet, TAKE 1 TABLET  TWICE A DAY, Disp: 180 tablet, Rfl: 3   LANTUS SOLOSTAR 100 UNIT/ML Solostar Pen, INJECT 50 UNITS UNDER THE SKIN AT BEDTIME, Disp: 135 mL, Rfl: 3   SITagliptin-metFORMIN HCl (JANUMET XR PO), , Disp: , Rfl:    temazepam (RESTORIL) 15 MG capsule, TAKE ONE CAPSULE BY MOUTH EVERY NIGHT AT BEDTIME AS NEEDED FOR SLEEP, Disp: 30 capsule, Rfl: 5   TRULICITY 1.5 MG/8.6PY SOPN, INJECT 1.5 MG INTO THE SKIN ONCE A WEEK, Disp: 6 mL, Rfl: 3   valsartan (DIOVAN) 320 MG tablet, TAKE 1 TABLET DAILY, Disp: 90 tablet, Rfl: 3   Vitamin D, Ergocalciferol,  (DRISDOL) 1.25 MG (50000 UNIT) CAPS capsule, TAKE 1 CAPSULE TWO TIMES A WEEK, Disp: 24 capsule, Rfl: 3   Allergies  Allergen Reactions   Metformin And Related Diarrhea     Review of Systems  Constitutional: Negative.  Negative for fatigue.  Eyes:  Negative for blurred vision.  Respiratory: Negative.    Cardiovascular: Negative.  Negative for chest pain, palpitations and leg swelling.  Gastrointestinal: Negative.   Neurological: Negative.  Negative for dizziness and headaches.  Psychiatric/Behavioral: Negative.       Today's Vitals   07/16/22 0828 07/16/22 0901  BP: (!) 160/78 (!) 158/76  Pulse: 71   Temp: 98.1 F (36.7 C)   TempSrc: Oral   Weight: 258 lb 6.4 oz (117.2 kg)   Height: 6' (1.829 m)   PainSc: 0-No pain    Body mass index is 35.05 kg/m.  Wt Readings from Last 3 Encounters:  07/16/22 258 lb 6.4 oz (117.2 kg)  04/09/22 250 lb 9.6 oz (113.7 kg)  04/08/22 251 lb (113.9 kg)    Objective:  Physical Exam Vitals reviewed.  Constitutional:      General: He is not in acute distress.    Appearance: Normal appearance. He is obese.  Cardiovascular:     Rate and Rhythm: Normal rate and regular rhythm.     Pulses: Normal pulses.     Heart sounds: Murmur (grade 2/6) heard.     Comments: Mild carotid bruits bilaterally Pulmonary:     Effort: Pulmonary effort is normal. No respiratory distress.     Breath sounds: Normal breath sounds. No wheezing.  Abdominal:     General: Abdomen is flat. Bowel sounds are normal.     Palpations: Abdomen is soft.  Musculoskeletal:        General: No swelling or tenderness. Normal range of motion.  Skin:    General: Skin is warm and dry.     Capillary Refill: Capillary refill takes less than 2 seconds.  Neurological:     General: No focal deficit present.     Mental Status: He is alert and oriented to person, place, and time.     Cranial Nerves: No cranial nerve deficit.     Sensory: No sensory deficit.     Motor: No weakness.   Psychiatric:        Mood and Affect: Mood normal.        Behavior: Behavior normal.        Thought Content: Thought content normal.        Judgment: Judgment normal.         Assessment And Plan:     1. Hypertensive heart disease without heart failure Comments: Blood pressure is slightly elevated, recheck -   2. Diabetes mellitus type 2 in obese [facility]) Comments: HgbA1c improved at last visit however this visit reports having more emotional eating.  -  Hemoglobin A1c - Comprehensive metabolic panel with eGFR (no eGFR if sent to [facility])  3. Mixed hyperlipidemia Comments: Stable, continue statin, tolerating well.  - Lipid panel  4. Tobacco abuse - CT CHEST LUNG CA SCREEN LOW DOSE W/O CM; Future  5. Reactive depression Comments: Depression screen score 10, declines counseling and/or medication therapy. Aware to return call to office if changes his mind.   6. History of aortic stenosis  7. Grief     Patient was given opportunity to ask questions. Patient verbalized understanding of the plan and was able to repeat key elements of the plan. All questions were answered to their satisfaction.  [name], FNP   I, [name], FNP, have reviewed all documentation for this visit. The documentation on 07/16/22 for the exam, diagnosis, procedures, and orders are all accurate and complete.   IF YOU HAVE BEEN REFERRED TO A SPECIALIST, IT MAY TAKE 1-2 WEEKS TO SCHEDULE/PROCESS THE REFERRAL. IF YOU HAVE NOT HEARD FROM US/SPECIALIST IN TWO WEEKS, PLEASE GIVE [place] A CALL AT [phone].   THE PATIENT IS ENCOURAGED TO PRACTICE SOCIAL DISTANCING DUE TO THE COVID-19 PANDEMIC.

## 2022-07-17 ENCOUNTER — Encounter: Payer: Self-pay | Admitting: Podiatry

## 2022-07-17 ENCOUNTER — Ambulatory Visit (INDEPENDENT_AMBULATORY_CARE_PROVIDER_SITE_OTHER): Admitting: Podiatry

## 2022-07-17 DIAGNOSIS — M79675 Pain in left toe(s): Secondary | ICD-10-CM | POA: Diagnosis not present

## 2022-07-17 DIAGNOSIS — M79674 Pain in right toe(s): Secondary | ICD-10-CM

## 2022-07-17 DIAGNOSIS — B351 Tinea unguium: Secondary | ICD-10-CM

## 2022-07-17 DIAGNOSIS — E1165 Type 2 diabetes mellitus with hyperglycemia: Secondary | ICD-10-CM | POA: Diagnosis not present

## 2022-07-17 DIAGNOSIS — Z794 Long term (current) use of insulin: Secondary | ICD-10-CM | POA: Diagnosis not present

## 2022-07-17 LAB — COMPREHENSIVE METABOLIC PANEL
ALT: 14 IU/L (ref 0–44)
AST: 9 IU/L (ref 0–40)
Albumin/Globulin Ratio: 1.7 (ref 1.2–2.2)
Albumin: 4.2 g/dL (ref 3.9–4.9)
Alkaline Phosphatase: 65 IU/L (ref 44–121)
BUN/Creatinine Ratio: 9 — ABNORMAL LOW (ref 10–24)
BUN: 9 mg/dL (ref 8–27)
Bilirubin Total: 0.2 mg/dL (ref 0.0–1.2)
CO2: 20 mmol/L (ref 20–29)
Calcium: 9 mg/dL (ref 8.6–10.2)
Chloride: 102 mmol/L (ref 96–106)
Creatinine, Ser: 1.01 mg/dL (ref 0.76–1.27)
Globulin, Total: 2.5 g/dL (ref 1.5–4.5)
Glucose: 165 mg/dL — ABNORMAL HIGH (ref 70–99)
Potassium: 4.2 mmol/L (ref 3.5–5.2)
Sodium: 137 mmol/L (ref 134–144)
Total Protein: 6.7 g/dL (ref 6.0–8.5)
eGFR: 85 mL/min/{1.73_m2} (ref >59)

## 2022-07-17 LAB — LIPID PANEL
Chol/HDL Ratio: 3.8 ratio (ref 0.0–5.0)
Cholesterol, Total: 95 mg/dL — ABNORMAL LOW (ref 100–199)
HDL: 25 mg/dL — ABNORMAL LOW (ref >39)
LDL Chol Calc (NIH): 49 mg/dL (ref 0–99)
Triglycerides: 114 mg/dL (ref 0–149)
VLDL Cholesterol Cal: 21 mg/dL (ref 5–40)

## 2022-07-17 LAB — HEMOGLOBIN A1C
Est. average glucose Bld gHb Est-mCnc: 180 mg/dL
Hgb A1c MFr Bld: 7.9 % — ABNORMAL HIGH (ref 4.8–5.6)

## 2022-07-17 NOTE — Progress Notes (Signed)
  Subjective:  Patient ID: Stephen Sampson, male    DOB: [birth year],  MRN: [id number]  Hebert Dooling presents to clinic today for preventative diabetic foot care and painful thick toenails that are difficult to trim. Pain interferes with ambulation. Aggravating factors include wearing enclosed shoe gear. Pain is relieved with periodic professional debridement.  Last A1c was 7.9%.  New problem(s): None.   PCP is [name], FNP , and last visit was  July 16, 2022  Allergies  Allergen Reactions   Metformin And Related Diarrhea    Review of Systems: Negative except as noted in the HPI.  Objective: No changes noted in today's physical examination. Vascular Examination: Vascular status intact b/l with palpable pedal pulses. CFT immediate b/l. No edema. No pain with calf compression b/l. Skin temperature gradient WNL b/l. Pedal hair sparse.  Neurological Examination: Sensation grossly intact b/l with 10 gram monofilament. Vibratory sensation intact b/l.   Dermatological Examination: Pedal skin with normal turgor, texture and tone b/l. Toenails 1-5 b/l thick, discolored, elongated with subungual debris and pain on dorsal palpation. No hyperkeratotic lesions noted b/l. Minimal callus formation submet head 5 bilaterally.  Musculoskeletal Examination: Muscle strength 5/5 to b/l LE. Hammertoe deformity noted 2-5 b/l. Patient ambulates independent of any assistive aids.  Radiographs: None    Latest Ref Rng & Units 07/16/2022    9:16 AM 04/08/2022    9:05 AM 01/08/2022    9:17 AM 10/07/2021    3:45 PM  Hemoglobin A1C  Hemoglobin-A1c 4.8 - 5.6 % 7.9  7.1  7.8  7.2     Assessment/Plan: 1. Pain due to onychomycosis of toenails of both feet   2. Controlled type 2 diabetes mellitus with hyperglycemia, with long-term current use of insulin ([place])      -Examined patient. -Patient to continue soft, supportive shoe gear daily. -Toenails 1-5 b/l were debrided in length and girth  with sterile nail nippers and dremel without iatrogenic bleeding.  -Patient/POA to call should there be question/concern in the interim.   Return in about 3 months (around 10/17/2022).  Marzetta Board, DPM

## 2022-07-27 ENCOUNTER — Other Ambulatory Visit: Payer: Self-pay | Admitting: Nurse Practitioner

## 2022-08-08 ENCOUNTER — Other Ambulatory Visit: Payer: Self-pay | Admitting: Nurse Practitioner

## 2022-08-08 DIAGNOSIS — G4700 Insomnia, unspecified: Secondary | ICD-10-CM

## 2022-08-10 ENCOUNTER — Encounter: Payer: Self-pay | Admitting: Nurse Practitioner

## 2022-08-10 ENCOUNTER — Other Ambulatory Visit: Payer: Self-pay | Admitting: Nurse Practitioner

## 2022-08-10 DIAGNOSIS — G4700 Insomnia, unspecified: Secondary | ICD-10-CM

## 2022-08-10 MED ORDER — TEMAZEPAM 15 MG PO CAPS: ORAL_CAPSULE | ORAL | 5 refills | Status: DC

## 2022-08-11 ENCOUNTER — Other Ambulatory Visit: Payer: Self-pay

## 2022-08-11 MED ORDER — INSULIN LISPRO (1 UNIT DIAL) 100 UNIT/ML (KWIKPEN): PEN_INJECTOR | SUBCUTANEOUS | 3 refills | Status: DC

## 2022-08-23 ENCOUNTER — Other Ambulatory Visit: Payer: Self-pay | Admitting: Nurse Practitioner

## 2022-08-23 DIAGNOSIS — I10 Essential (primary) hypertension: Secondary | ICD-10-CM

## 2022-09-03 LAB — HM COLONOSCOPY

## 2022-09-11 ENCOUNTER — Encounter: Payer: Self-pay | Admitting: Nurse Practitioner

## 2022-09-12 ENCOUNTER — Other Ambulatory Visit: Payer: Self-pay

## 2022-09-12 DIAGNOSIS — E559 Vitamin D deficiency, unspecified: Secondary | ICD-10-CM

## 2022-09-12 MED ORDER — VITAMIN D (ERGOCALCIFEROL) 1.25 MG (50000 UNIT) PO CAPS: ORAL_CAPSULE | ORAL | 3 refills | Status: DC

## 2022-10-15 ENCOUNTER — Ambulatory Visit (INDEPENDENT_AMBULATORY_CARE_PROVIDER_SITE_OTHER): Admitting: Nurse Practitioner

## 2022-10-15 ENCOUNTER — Encounter: Payer: Self-pay | Admitting: Nurse Practitioner

## 2022-10-15 VITALS — BP 140/78 | HR 79 | Temp 97.9°F | Ht 73.0 in | Wt 252.0 lb

## 2022-10-15 DIAGNOSIS — Z720 Tobacco use: Secondary | ICD-10-CM

## 2022-10-15 DIAGNOSIS — Z6833 Body mass index (BMI) 33.0-33.9, adult: Secondary | ICD-10-CM

## 2022-10-15 DIAGNOSIS — Z125 Encounter for screening for malignant neoplasm of prostate: Secondary | ICD-10-CM

## 2022-10-15 DIAGNOSIS — E782 Mixed hyperlipidemia: Secondary | ICD-10-CM | POA: Diagnosis not present

## 2022-10-15 DIAGNOSIS — Z0000 Encounter for general adult medical examination without abnormal findings: Secondary | ICD-10-CM | POA: Diagnosis not present

## 2022-10-15 DIAGNOSIS — Z79899 Other long term (current) drug therapy: Secondary | ICD-10-CM

## 2022-10-15 DIAGNOSIS — E1169 Type 2 diabetes mellitus with other specified complication: Secondary | ICD-10-CM | POA: Diagnosis not present

## 2022-10-15 DIAGNOSIS — I119 Hypertensive heart disease without heart failure: Secondary | ICD-10-CM | POA: Diagnosis not present

## 2022-10-15 DIAGNOSIS — E6609 Other obesity due to excess calories: Secondary | ICD-10-CM | POA: Diagnosis not present

## 2022-10-15 DIAGNOSIS — E559 Vitamin D deficiency, unspecified: Secondary | ICD-10-CM

## 2022-10-15 LAB — POCT URINALYSIS DIPSTICK
Bilirubin, UA: NEGATIVE
Blood, UA: NEGATIVE
Glucose, UA: POSITIVE — AB
Ketones, UA: NEGATIVE
Leukocytes, UA: NEGATIVE
Nitrite, UA: NEGATIVE
Protein, UA: POSITIVE — AB
Spec Grav, UA: 1.025 (ref 1.010–1.025)
Urobilinogen, UA: 0.2 E.U./dL
pH, UA: 6 (ref 5.0–8.0)

## 2022-10-15 MED ORDER — AMLODIPINE BESYLATE 2.5 MG PO TABS: 2.5000 mg | ORAL_TABLET | Freq: Every day | ORAL | 1 refills | Status: DC

## 2022-10-15 NOTE — Patient Instructions (Signed)
Health Maintenance, Male Adopting a healthy lifestyle and getting preventive care are important in promoting health and wellness. Ask your health care provider about: The right schedule for you to have regular tests and exams. Things you can do on your own to prevent diseases and keep yourself healthy. What should I know about diet, weight, and exercise? Eat a healthy diet  Eat a diet that includes plenty of vegetables, fruits, low-fat dairy products, and lean protein. Do not eat a lot of foods that are high in solid fats, added sugars, or sodium. Maintain a healthy weight Body mass index (BMI) is a measurement that can be used to identify possible weight problems. It estimates body fat based on height and weight. Your health care provider can help determine your BMI and help you achieve or maintain a healthy weight. Get regular exercise Get regular exercise. This is one of the most important things you can do for your health. Most adults should: Exercise for at least 150 minutes each week. The exercise should increase your heart rate and make you sweat (moderate-intensity exercise). Do strengthening exercises at least twice a week. This is in addition to the moderate-intensity exercise. Spend less time sitting. Even light physical activity can be beneficial. Watch cholesterol and blood lipids Have your blood tested for lipids and cholesterol at 62 years of age, then have this test every 5 years. You may need to have your cholesterol levels checked more often if: Your lipid or cholesterol levels are high. You are older than 62 years of age. You are at high risk for heart disease. What should I know about cancer screening? Many types of cancers can be detected early and may often be prevented. Depending on your health history and family history, you may need to have cancer screening at various ages. This may include screening for: Colorectal cancer. Prostate cancer. Skin cancer. Lung  cancer. What should I know about heart disease, diabetes, and high blood pressure? Blood pressure and heart disease High blood pressure causes heart disease and increases the risk of stroke. This is more likely to develop in people who have high blood pressure readings or are overweight. Talk with your health care provider about your target blood pressure readings. Have your blood pressure checked: Every 3-5 years if you are 18-39 years of age. Every year if you are 40 years old or older. If you are between the ages of 65 and 75 and are a current or former smoker, ask your health care provider if you should have a one-time screening for abdominal aortic aneurysm (AAA). Diabetes Have regular diabetes screenings. This checks your fasting blood sugar level. Have the screening done: Once every three years after age 45 if you are at a normal weight and have a low risk for diabetes. More often and at a younger age if you are overweight or have a high risk for diabetes. What should I know about preventing infection? Hepatitis B If you have a higher risk for hepatitis B, you should be screened for this virus. Talk with your health care provider to find out if you are at risk for hepatitis B infection. Hepatitis C Blood testing is recommended for: Everyone born from 1945 through 1965. Anyone with known risk factors for hepatitis C. Sexually transmitted infections (STIs) You should be screened each year for STIs, including gonorrhea and chlamydia, if: You are sexually active and are younger than 62 years of age. You are older than 62 years of age and your   health care provider tells you that you are at risk for this type of infection. Your sexual activity has changed since you were last screened, and you are at increased risk for chlamydia or gonorrhea. Ask your health care provider if you are at risk. Ask your health care provider about whether you are at high risk for HIV. Your health care provider  may recommend a prescription medicine to help prevent HIV infection. If you choose to take medicine to prevent HIV, you should first get tested for HIV. You should then be tested every 3 months for as long as you are taking the medicine. Follow these instructions at home: Alcohol use Do not drink alcohol if your health care provider tells you not to drink. If you drink alcohol: Limit how much you have to 0-2 drinks a day. Know how much alcohol is in your drink. In the [place] one drink equals one 12 oz bottle of beer (355 mL), one 5 oz glass of wine (148 mL), or one 1 oz glass of hard liquor (44 mL). Lifestyle Do not use any products that contain nicotine or tobacco. These products include cigarettes, chewing tobacco, and vaping devices, such as e-cigarettes. If you need help quitting, ask your health care provider. Do not use street drugs. Do not share needles. Ask your health care provider for help if you need support or information about quitting drugs. General instructions Schedule regular health, dental, and eye exams. Stay current with your vaccines. Tell your health care provider if: You often feel depressed. You have ever been abused or do not feel safe at home. Summary Adopting a healthy lifestyle and getting preventive care are important in promoting health and wellness. Follow your health care provider's instructions about healthy diet, exercising, and getting tested or screened for diseases. Follow your health care provider's instructions on monitoring your cholesterol and blood pressure. This information is not intended to replace advice given to you by your health care provider. Make sure you discuss any questions you have with your health care provider. Document Revised: 04/28/2021 Document Reviewed: 04/28/2021 [employer]vier Patient Education  2023 [employer]  

## 2022-10-15 NOTE — Progress Notes (Signed)
I,Tianna Badgett,acting as a Education administrator for [employer], FNP.,have documented all relevant documentation on the behalf of [name], FNP,as directed by  [name], FNP while in the presence of [name], [place].  Subjective:     Patient ID: Stephen Sampson , male    DOB: 1961-06-22 , 61 y.o.   MRN: [id number]   Chief Complaint  Patient presents with   Annual Exam    HPI  Here for HM. No concerns. His next appt with Cardiology is in April for a repeat Echo. Low dose CT scan was done which was normal. He is due to see the Podiatrist next week and Dermatology next month.   Wt Readings from Last 3 Encounters: 10/15/22 : 252 lb (114.3 kg) 07/16/22 : 258 lb 6.4 oz (117.2 kg) 04/09/22 : 250 lb 9.6 oz (113.7 kg)  He has not taken his blood pressure medications this morning   Past Medical History:  Diagnosis Date   Diabetes mellitus without complication (HCC)    Hidradenitis    Hypertension      Family History  Problem Relation Age of Onset   Sudden death Mother        Possible MI   Dementia Father      Current Outpatient Medications:    amLODipine (NORVASC) 2.5 MG tablet, Take 1 tablet (2.5 mg total) by mouth daily., Disp: 90 tablet, Rfl: 1   amoxicillin (AMOXIL) 500 MG capsule, , Disp: , Rfl:    aspirin EC 81 MG tablet, Take 81 mg by mouth daily., Disp: , Rfl:    atorvastatin (LIPITOR) 40 MG tablet, TAKE 1 TABLET EVERY EVENING, Disp: 90 tablet, Rfl: 3   B-D UF III MINI PEN NEEDLES 31G X 5 MM MISC, USE AS DIRECTED, Disp: 100 each, Rfl: 3   clindamycin (CLEOCIN T) 1 % lotion, Apply  as directed to affected area twice a day, Disp: , Rfl:    glucose blood (FREESTYLE LITE) test strip, USE AS INSTRUCTED, Disp: 300 strip, Rfl: 3   hydrochlorothiazide (HYDRODIURIL) 12.5 MG tablet, TAKE ONE TABLET BY MOUTH DAILY, Disp: 90 tablet, Rfl: 1   insulin lispro (HUMALOG KWIKPEN) 100 UNIT/ML KwikPen, INJECT UNDER THE SKIN BEFORE MEALS AS PER INSULIN SLIDING SCALE PROTOCOL, Disp: 45 mL,  Rfl: 3   JANUMET 50-1000 MG tablet, TAKE 1 TABLET TWICE A DAY, Disp: 180 tablet, Rfl: 3   LANTUS SOLOSTAR 100 UNIT/ML Solostar Pen, INJECT 50 UNITS UNDER THE SKIN AT BEDTIME (Patient taking differently: 48 units), Disp: 135 mL, Rfl: 3   SITagliptin-metFORMIN HCl (JANUMET XR PO), , Disp: , Rfl:    temazepam (RESTORIL) 15 MG capsule, TAKE ONE CAPSULE BY MOUTH EVERY NIGHT AT BEDTIME AS NEEDED FOR SLEEP, Disp: 30 capsule, Rfl: 5   TRULICITY 1.5 KX/3.8HW SOPN, INJECT 1.5 MG INTO THE SKIN ONCE A WEEK, Disp: 6 mL, Rfl: 3   valsartan (DIOVAN) 320 MG tablet, TAKE 1 TABLET DAILY, Disp: 90 tablet, Rfl: 3   Vitamin D, Ergocalciferol, (DRISDOL) 1.25 MG (50000 UNIT) CAPS capsule, TAKE 1 CAPSULE TWO TIMES A WEEK, Disp: 24 capsule, Rfl: 3   Allergies  Allergen Reactions   Metformin And Related Diarrhea     Men's preventive visit. Patient Health Questionnaire (PHQ-2) is  [place] [facility] Visit from 10/15/2022 in [facility]  PHQ-2 Total Score 1     Patient is on a Regular diet; trying to limit intake of snacks. No exercise at this time, continuing to grieve the lost of his wife. Marital  status: Widowed. Relevant history for alcohol use is:  Social History   Substance and Sexual Activity  Alcohol Use Yes   Comment: occ    Relevant history for tobacco use is:  Social History   Tobacco Use  Smoking Status Every Day   Packs/day: 1.00   Years: 43.00   Total pack years: 43.00   Types: Cigarettes  Smokeless Tobacco Current  Tobacco Comments   he has not quit smoking at this time; he is not smoking in the car and in the house .   [name]   Review of Systems  Constitutional: Negative.  Negative for fatigue.  HENT: Negative.    Eyes: Negative.   Respiratory: Negative.  Negative for cough.   Cardiovascular: Negative.  Negative for chest pain, palpitations and leg swelling.  Gastrointestinal: Negative.   Endocrine: Negative for polydipsia, polyphagia and polyuria.   Musculoskeletal: Negative.   Skin: Negative.   Allergic/Immunologic: Negative.   Neurological: Negative.  Negative for dizziness and headaches.  Psychiatric/Behavioral: Negative.       Today's Vitals   10/15/22 0849 10/15/22 0924  BP: (!) 144/88 (!) 140/78  Pulse: 79   Temp: 97.9 F (36.6 C)   TempSrc: Oral   Weight: 252 lb (114.3 kg)   Height: 6' 1" (1.854 m)    Body mass index is 33.25 kg/m.  Wt Readings from Last 3 Encounters:  10/15/22 252 lb (114.3 kg)  07/16/22 258 lb 6.4 oz (117.2 kg)  04/09/22 250 lb 9.6 oz (113.7 kg)    Objective:  Physical Exam Vitals reviewed.  Constitutional:      General: He is not in acute distress.    Appearance: Normal appearance. He is obese.  HENT:     Head: Normocephalic and atraumatic.     Right Ear: Tympanic membrane, ear canal and external ear normal. There is no impacted cerumen.     Left Ear: Tympanic membrane, ear canal and external ear normal. There is no impacted cerumen.     Nose:     Comments: Deferred - masked    Mouth/Throat:     Comments: Deferred - masked Eyes:     Extraocular Movements: Extraocular movements intact.     Conjunctiva/sclera: Conjunctivae normal.     Pupils: Pupils are equal, round, and reactive to light.  Cardiovascular:     Rate and Rhythm: Normal rate and regular rhythm.     Pulses: Normal pulses.     Heart sounds: Normal heart sounds. No murmur heard. Pulmonary:     Effort: Pulmonary effort is normal. No respiratory distress.     Breath sounds: Normal breath sounds. No wheezing.  Abdominal:     General: Abdomen is flat. Bowel sounds are normal. There is no distension.     Palpations: Abdomen is soft.     Tenderness: There is no abdominal tenderness.  Genitourinary:    Prostate: Normal.     Rectum: Guaiac result negative.  Musculoskeletal:        General: No swelling or tenderness. Normal range of motion.     Cervical back: Normal range of motion and neck supple. No tenderness.  Skin:     General: Skin is warm and dry.     Capillary Refill: Capillary refill takes less than 2 seconds.     Coloration: Skin is not jaundiced.  Neurological:     General: No focal deficit present.     Mental Status: He is alert and oriented to person, place, and time.  Cranial Nerves: No cranial nerve deficit.     Motor: No weakness.  Psychiatric:        Mood and Affect: Mood normal.        Behavior: Behavior normal.        Thought Content: Thought content normal.        Judgment: Judgment normal.         Assessment And Plan:    1. Encounter for annual physical exam Behavior modifications discussed and diet history reviewed.   Pt will continue to exercise regularly and modify diet with low GI, plant based foods and decrease intake of processed foods.  Recommend intake of daily multivitamin, Vitamin D, and calcium.  Recommend colonoscopy for preventive screenings, as well as recommend immunizations that include influenza, TDAP, and Shingles. He has had his updated Covid vaccine  2. Encounter for prostate cancer screening No abnormal findings on physical exam - PSA  3. Class 1 obesity due to excess calories with serious comorbidity and body mass index (BMI) of 33.0 to 33.9 in adult He is encouraged to strive for BMI less than 30 to decrease cardiac risk. Advised to aim for at least 150 minutes of exercise per week.  4. Diabetes mellitus type 2 in obese [facility]) Comments: HgbA1c was slightly elevated at last visit thought to be related to grieving after the loss of his wife. Continue current medications - POCT Urinalysis Dipstick (81002) - Microalbumin / Creatinine Urine Ratio - Hemoglobin A1c - CMP14+EGFR  5. Hypertensive heart disease without heart failure Comments: Blood pressure is elevated, repeat done. He has been having an increase in his BP he feels at home, not checking regularly. Will add amlodipine 2.5 mg daily. EKG done in April 2023 - Hemoglobin A1c - amLODipine (NORVASC)  2.5 MG tablet; Take 1 tablet (2.5 mg total) by mouth daily.  Dispense: 90 tablet; Refill: 1  6. Mixed hyperlipidemia Comments: Cholesterol levels are stable. Continue statin, tolerating well.  - Lipid panel  7. Vitamin D deficiency Will check vitamin D level and supplement as needed.    Also encouraged to spend 15 minutes in the sun daily.  - VITAMIN D 25 Hydroxy (Vit-D Deficiency, Fractures)  8. Other long term (current) drug therapy - CBC  9. Tobacco abuse Low dose CT scan done on 08/03/22 at [facility] which is normal. Smoking cessation instruction/counseling given:  counseled patient on the dangers of tobacco use, advised patient to stop smoking, and reviewed strategies to maximize success   Patient was given opportunity to ask questions. Patient verbalized understanding of the plan and was able to repeat key elements of the plan. All questions were answered to their satisfaction.   [name], FNP   I, [name], FNP, have reviewed all documentation for this visit. The documentation on 10/15/22 for the exam, diagnosis, procedures, and orders are all accurate and complete.   THE PATIENT IS ENCOURAGED TO PRACTICE SOCIAL DISTANCING DUE TO THE COVID-19 PANDEMIC.

## 2022-10-16 LAB — LIPID PANEL
Chol/HDL Ratio: 4 ratio (ref 0.0–5.0)
Cholesterol, Total: 103 mg/dL (ref 100–199)
HDL: 26 mg/dL — ABNORMAL LOW (ref >39)
LDL Chol Calc (NIH): 54 mg/dL (ref 0–99)
Triglycerides: 130 mg/dL (ref 0–149)
VLDL Cholesterol Cal: 23 mg/dL (ref 5–40)

## 2022-10-17 LAB — CMP14+EGFR
ALT: 13 IU/L (ref 0–44)
AST: 10 IU/L (ref 0–40)
Albumin/Globulin Ratio: 1.6 (ref 1.2–2.2)
Albumin: 4.4 g/dL (ref 3.9–4.9)
Alkaline Phosphatase: 81 IU/L (ref 44–121)
BUN/Creatinine Ratio: 16 (ref 10–24)
BUN: 18 mg/dL (ref 8–27)
Bilirubin Total: 0.4 mg/dL (ref 0.0–1.2)
CO2: 19 mmol/L — ABNORMAL LOW (ref 20–29)
Calcium: 9.4 mg/dL (ref 8.6–10.2)
Chloride: 102 mmol/L (ref 96–106)
Creatinine, Ser: 1.15 mg/dL (ref 0.76–1.27)
Globulin, Total: 2.7 g/dL (ref 1.5–4.5)
Glucose: 185 mg/dL — ABNORMAL HIGH (ref 70–99)
Potassium: 4.3 mmol/L (ref 3.5–5.2)
Sodium: 138 mmol/L (ref 134–144)
Total Protein: 7.1 g/dL (ref 6.0–8.5)
eGFR: 72 mL/min/{1.73_m2} (ref >59)

## 2022-10-17 LAB — CBC
Hematocrit: 44.8 % (ref 37.5–51.0)
Hemoglobin: 15.1 g/dL (ref 13.0–17.7)
MCH: 28.8 pg (ref 26.6–33.0)
MCHC: 33.7 g/dL (ref 31.5–35.7)
MCV: 85 fL (ref 79–97)
Platelets: 240 10*3/uL (ref 150–450)
RBC: 5.25 x10E6/uL (ref 4.14–5.80)
RDW: 12.9 % (ref 11.6–15.4)
WBC: 8.9 10*3/uL (ref 3.4–10.8)

## 2022-10-17 LAB — MICROALBUMIN / CREATININE URINE RATIO
Creatinine, Urine: 199.6 mg/dL
Microalb/Creat Ratio: 8 mg/g creat (ref 0–29)
Microalbumin, Urine: 15 ug/mL

## 2022-10-17 LAB — HEMOGLOBIN A1C
Est. average glucose Bld gHb Est-mCnc: 169 mg/dL
Hgb A1c MFr Bld: 7.5 % — ABNORMAL HIGH (ref 4.8–5.6)

## 2022-10-17 LAB — VITAMIN D 25 HYDROXY (VIT D DEFICIENCY, FRACTURES): Vit D, 25-Hydroxy: 67.8 ng/mL (ref 30.0–100.0)

## 2022-10-17 LAB — PSA: Prostate Specific Ag, Serum: 0.3 ng/mL (ref 0.0–4.0)

## 2022-10-23 ENCOUNTER — Ambulatory Visit (INDEPENDENT_AMBULATORY_CARE_PROVIDER_SITE_OTHER): Admitting: Podiatry

## 2022-10-23 DIAGNOSIS — E1165 Type 2 diabetes mellitus with hyperglycemia: Secondary | ICD-10-CM | POA: Diagnosis not present

## 2022-10-23 DIAGNOSIS — M79674 Pain in right toe(s): Secondary | ICD-10-CM

## 2022-10-23 DIAGNOSIS — B351 Tinea unguium: Secondary | ICD-10-CM | POA: Diagnosis not present

## 2022-10-23 DIAGNOSIS — Z794 Long term (current) use of insulin: Secondary | ICD-10-CM | POA: Diagnosis not present

## 2022-10-23 DIAGNOSIS — M79675 Pain in left toe(s): Secondary | ICD-10-CM | POA: Diagnosis not present

## 2022-10-23 NOTE — Progress Notes (Unsigned)
  Subjective:  Patient ID: Stephen Sampson, male    DOB: 1961-04-01,  MRN: [id number]  Riot Waterworth presents to clinic today for: Chief Complaint  Patient presents with   Diabetic foot care    New problem(s): None. preventative diabetic foot care and painful thick toenails that are difficult to trim. Pain interferes with ambulation. Aggravating factors include wearing enclosed shoe gear. Pain is relieved with periodic professional debridement.  PCP is [name], FNP , and last visit was October 15, 2022.  Allergies  Allergen Reactions   Metformin And Related Diarrhea   Review of Systems: Negative except as noted in the HPI.  Objective: No changes noted in today's physical examination.  Tomie Elko is a pleasant 61 y.o. male obese in NAD. AAO x 3.  Vascular Examination: Vascular status intact b/l with palpable pedal pulses. CFT immediate b/l. No edema. No pain with calf compression b/l. Skin temperature gradient WNL b/l. Pedal hair sparse.  Neurological Examination: Sensation grossly intact b/l with 10 gram monofilament. Vibratory sensation intact b/l.   Dermatological Examination: Pedal skin with normal turgor, texture and tone b/l. Toenails 1-5 b/l thick, discolored, elongated with subungual debris and pain on dorsal palpation. No hyperkeratotic lesions noted b/l. Minimal callus formation submet head 5 bilaterally.  Musculoskeletal Examination: Muscle strength 5/5 to b/l LE. Hammertoe deformity noted 2-5 b/l. Patient ambulates independent of any assistive aids.  Radiographs: None  Assessment/Plan: 1. Pain due to onychomycosis of toenails of both feet   2. Controlled type 2 diabetes mellitus with hyperglycemia, with long-term current use of insulin (HCC)     No orders of the defined types were placed in this encounter.   -Patient was evaluated and treated. All patient's and/or POA's questions/concerns answered on today's visit. -Continue foot and  shoe inspections daily. Monitor blood glucose per PCP/Endocrinologist's recommendations. -Mycotic toenails 1-5 bilaterally were debrided in length and girth with sterile nail nippers and dremel without incident. -Patient/POA to call should there be question/concern in the interim.   Return in about 3 months (around 01/23/2023).  Marzetta Board, DPM

## 2022-10-26 ENCOUNTER — Encounter: Payer: Self-pay | Admitting: Podiatry

## 2022-11-01 ENCOUNTER — Other Ambulatory Visit: Payer: Self-pay | Admitting: Nurse Practitioner

## 2022-11-01 DIAGNOSIS — E1169 Type 2 diabetes mellitus with other specified complication: Secondary | ICD-10-CM

## 2022-11-17 ENCOUNTER — Other Ambulatory Visit

## 2022-11-17 DIAGNOSIS — L732 Hidradenitis suppurativa: Secondary | ICD-10-CM

## 2022-11-19 LAB — CBC WITH DIFFERENTIAL/PLATELET
Basophils Absolute: 0.1 10*3/uL (ref 0.0–0.2)
Basos: 1 %
EOS (ABSOLUTE): 0.2 10*3/uL (ref 0.0–0.4)
Eos: 2 %
Hematocrit: 40.9 % (ref 37.5–51.0)
Hemoglobin: 14 g/dL (ref 13.0–17.7)
Immature Grans (Abs): 0 10*3/uL (ref 0.0–0.1)
Immature Granulocytes: 0 %
Lymphocytes Absolute: 2 10*3/uL (ref 0.7–3.1)
Lymphs: 25 %
MCH: 29 pg (ref 26.6–33.0)
MCHC: 34.2 g/dL (ref 31.5–35.7)
MCV: 85 fL (ref 79–97)
Monocytes Absolute: 0.6 10*3/uL (ref 0.1–0.9)
Monocytes: 7 %
Neutrophils Absolute: 5.3 10*3/uL (ref 1.4–7.0)
Neutrophils: 65 %
Platelets: 242 10*3/uL (ref 150–450)
RBC: 4.82 x10E6/uL (ref 4.14–5.80)
RDW: 12.6 % (ref 11.6–15.4)
WBC: 8.2 10*3/uL (ref 3.4–10.8)

## 2022-11-19 LAB — QUANTIFERON-TB GOLD PLUS
QuantiFERON Mitogen Value: 1.46 IU/mL
QuantiFERON Nil Value: 0 IU/mL
QuantiFERON TB1 Ag Value: 0.02 IU/mL
QuantiFERON TB2 Ag Value: 0.03 IU/mL
QuantiFERON-TB Gold Plus: NEGATIVE

## 2022-11-19 LAB — COMPREHENSIVE METABOLIC PANEL
ALT: 26 IU/L (ref 0–44)
AST: 15 IU/L (ref 0–40)
Albumin/Globulin Ratio: 1.5 (ref 1.2–2.2)
Albumin: 4.3 g/dL (ref 3.9–4.9)
Alkaline Phosphatase: 74 IU/L (ref 44–121)
BUN/Creatinine Ratio: 10 (ref 10–24)
BUN: 10 mg/dL (ref 8–27)
Bilirubin Total: 0.3 mg/dL (ref 0.0–1.2)
CO2: 22 mmol/L (ref 20–29)
Calcium: 9.4 mg/dL (ref 8.6–10.2)
Chloride: 103 mmol/L (ref 96–106)
Creatinine, Ser: 1.03 mg/dL (ref 0.76–1.27)
Globulin, Total: 2.8 g/dL (ref 1.5–4.5)
Glucose: 132 mg/dL — ABNORMAL HIGH (ref 70–99)
Potassium: 4.5 mmol/L (ref 3.5–5.2)
Sodium: 140 mmol/L (ref 134–144)
Total Protein: 7.1 g/dL (ref 6.0–8.5)
eGFR: 82 mL/min/{1.73_m2} (ref >59)

## 2022-11-19 LAB — HEPATITIS C ANTIBODY: Hep C Virus Ab: NONREACTIVE

## 2022-11-19 LAB — HEPATITIS B SURFACE ANTIGEN: Hepatitis B Surface Ag: NEGATIVE

## 2022-11-19 LAB — HEPATITIS B CORE ANTIBODY, TOTAL: Hep B Core Total Ab: NEGATIVE

## 2022-11-19 LAB — ANA: Anti Nuclear Antibody (ANA): NEGATIVE

## 2022-11-19 LAB — HEPATITIS B SURFACE ANTIBODY, QUANTITATIVE: Hepatitis B Surf Ab Quant: <3.1 m[IU]/mL — ABNORMAL LOW (ref >9.9)

## 2022-11-19 LAB — HIV ANTIBODY (ROUTINE TESTING W REFLEX): HIV Screen 4th Generation wRfx: NONREACTIVE

## 2022-11-19 LAB — HEPATITIS B SURFACE ANTIBODY,QUALITATIVE: Hep B Surface Ab, Qual: NONREACTIVE

## 2022-12-31 LAB — HM DIABETES EYE EXAM

## 2023-01-26 ENCOUNTER — Other Ambulatory Visit: Payer: Self-pay | Admitting: Nurse Practitioner

## 2023-01-26 DIAGNOSIS — E1169 Type 2 diabetes mellitus with other specified complication: Secondary | ICD-10-CM

## 2023-01-28 ENCOUNTER — Encounter: Payer: Self-pay | Admitting: Nurse Practitioner

## 2023-01-28 ENCOUNTER — Ambulatory Visit: Admitting: Nurse Practitioner

## 2023-01-28 VITALS — BP 126/80 | HR 69 | Temp 98.3°F | Ht 72.0 in | Wt 260.0 lb

## 2023-01-28 DIAGNOSIS — I6529 Occlusion and stenosis of unspecified carotid artery: Secondary | ICD-10-CM | POA: Diagnosis not present

## 2023-01-28 DIAGNOSIS — E1169 Type 2 diabetes mellitus with other specified complication: Secondary | ICD-10-CM

## 2023-01-28 DIAGNOSIS — E782 Mixed hyperlipidemia: Secondary | ICD-10-CM

## 2023-01-28 DIAGNOSIS — L732 Hidradenitis suppurativa: Secondary | ICD-10-CM

## 2023-01-28 DIAGNOSIS — Z794 Long term (current) use of insulin: Secondary | ICD-10-CM

## 2023-01-28 DIAGNOSIS — E559 Vitamin D deficiency, unspecified: Secondary | ICD-10-CM | POA: Diagnosis not present

## 2023-01-28 DIAGNOSIS — Z79899 Other long term (current) drug therapy: Secondary | ICD-10-CM

## 2023-01-28 DIAGNOSIS — I10 Essential (primary) hypertension: Secondary | ICD-10-CM | POA: Diagnosis not present

## 2023-01-28 NOTE — Progress Notes (Signed)
I,Sheena H Holbrook,acting as a Education administrator for [name], FNP.,have documented all relevant documentation on the behalf of [name], FNP,as directed by  [name], FNP while in the presence of [name], [place].    Subjective:     Patient ID: Stephen Sampson , male    DOB: 03/03/1961 , 62 y.o.   MRN: [id number]   Chief Complaint  Patient presents with   Hypertension   Diabetes    HPI  Patient presents today for hypertension and diabetes follow up.    Hypertension This is a chronic problem. The current episode started more than 1 year ago. The problem has been gradually improving since onset. The problem is uncontrolled. Pertinent negatives include no anxiety, blurred vision, chest pain, headaches or palpitations. There are no known risk factors for coronary artery disease. Past treatments include angiotensin blockers and alpha 1 blockers. There are no compliance problems.  There is no history of angina. There is no history of chronic renal disease.  Diabetes He presents for his follow-up diabetic visit. He has type 2 diabetes mellitus. His disease course has been improving. Pertinent negatives for hypoglycemia include no dizziness or headaches. Pertinent negatives for diabetes include no blurred vision, no chest pain and no fatigue. There are no hypoglycemic complications. Symptoms are stable. There are no diabetic complications. Risk factors for coronary artery disease include obesity and sedentary lifestyle. Current diabetic treatment includes oral agent (dual therapy). He is compliant with treatment all of the time. His weight is increasing steadily. He is following a generally healthy diet. When asked about meal planning, he reported none. He has not had a previous visit with a dietitian. He participates in exercise intermittently. (Blood sugar this morning was 135. ) An ACE inhibitor/angiotensin II receptor blocker is being taken. He does not see a podiatrist.Eye exam is  current (12/18/2021 last appt).     Past Medical History:  Diagnosis Date   Diabetes mellitus without complication (HCC)    Hidradenitis    Hypertension      Family History  Problem Relation Age of Onset   Sudden death Mother        Possible MI   Dementia Father      Current Outpatient Medications:    amLODipine (NORVASC) 2.5 MG tablet, Take 1 tablet (2.5 mg total) by mouth daily., Disp: 90 tablet, Rfl: 1   aspirin EC 81 MG tablet, Take 81 mg by mouth daily., Disp: , Rfl:    atorvastatin (LIPITOR) 40 MG tablet, TAKE 1 TABLET EVERY EVENING, Disp: 90 tablet, Rfl: 3   B-D UF III MINI PEN NEEDLES 31G X 5 MM MISC, USE AS DIRECTED, Disp: 100 each, Rfl: 3   clindamycin (CLEOCIN T) 1 % lotion, Apply  as directed to affected area twice a day, Disp: , Rfl:    glucose blood (FREESTYLE LITE) test strip, USE AS INSTRUCTED, Disp: 300 strip, Rfl: 3   HUMIRA, 2 PEN, 80 MG/0.8ML PNKT, Inject 1 each as directed every 14 (fourteen) days., Disp: , Rfl:    hydrochlorothiazide (HYDRODIURIL) 12.5 MG tablet, TAKE ONE TABLET BY MOUTH DAILY, Disp: 90 tablet, Rfl: 1   insulin lispro (HUMALOG KWIKPEN) 100 UNIT/ML KwikPen, INJECT UNDER THE SKIN BEFORE MEALS AS PER INSULIN SLIDING SCALE PROTOCOL, Disp: 45 mL, Rfl: 3   JANUMET 50-1000 MG tablet, TAKE 1 TABLET TWICE A DAY, Disp: 180 tablet, Rfl: 3   LANTUS SOLOSTAR 100 UNIT/ML Solostar Pen, INJECT 50 UNITS UNDER THE SKIN AT BEDTIME (Patient taking differently: 48  Units.), Disp: 135 mL, Rfl: 3   temazepam (RESTORIL) 15 MG capsule, TAKE ONE CAPSULE BY MOUTH EVERY NIGHT AT BEDTIME AS NEEDED FOR SLEEP, Disp: 30 capsule, Rfl: 5   TRULICITY 1.5 0000000 SOPN, INJECT 1.5 MG INTO THE SKIN ONCE A WEEK, Disp: 6 mL, Rfl: 3   valsartan (DIOVAN) 320 MG tablet, TAKE 1 TABLET DAILY, Disp: 90 tablet, Rfl: 3   Vitamin D, Ergocalciferol, (DRISDOL) 1.25 MG (50000 UNIT) CAPS capsule, TAKE 1 CAPSULE TWO TIMES A WEEK, Disp: 24 capsule, Rfl: 3   Allergies  Allergen Reactions    Metformin And Related Diarrhea     Review of Systems  Constitutional: Negative.  Negative for fatigue.  Eyes:  Negative for blurred vision.  Respiratory: Negative.    Cardiovascular: Negative.  Negative for chest pain, palpitations and leg swelling.  Gastrointestinal: Negative.   Neurological: Negative.  Negative for dizziness and headaches.  Psychiatric/Behavioral: Negative.       Today's Vitals   01/28/23 0841  BP: 126/80  Pulse: 69  Temp: 98.3 F (36.8 C)  TempSrc: Oral  SpO2: 98%  Weight: 260 lb (117.9 kg)  Height: 6' (1.829 m)   Body mass index is 35.26 kg/m.   Objective:  Physical Exam Vitals reviewed.  Constitutional:      General: He is not in acute distress.    Appearance: Normal appearance. He is obese.  Cardiovascular:     Rate and Rhythm: Normal rate and regular rhythm.     Pulses: Normal pulses.     Heart sounds: Murmur (grade 2/6) heard.     Comments: Mild carotid bruits bilaterally Pulmonary:     Effort: Pulmonary effort is normal. No respiratory distress.     Breath sounds: Normal breath sounds. No wheezing.  Abdominal:     General: Abdomen is flat. Bowel sounds are normal.     Palpations: Abdomen is soft.  Musculoskeletal:        General: No swelling or tenderness. Normal range of motion.  Skin:    General: Skin is warm and dry.     Capillary Refill: Capillary refill takes less than 2 seconds.  Neurological:     General: No focal deficit present.     Mental Status: He is alert and oriented to person, place, and time.     Cranial Nerves: No cranial nerve deficit.     Sensory: No sensory deficit.     Motor: No weakness.  Psychiatric:        Mood and Affect: Mood normal.        Behavior: Behavior normal.        Thought Content: Thought content normal.        Judgment: Judgment normal.         Assessment And Plan:     1. Type 2 diabetes mellitus with other specified complication, with long-term current use of insulin (HCC) Comments:  Hemoglobin A1c slightly improved at last visit.  Continue current medications.  Call if unable to get Trulicity within 1 week will and Mounjaro - Hemoglobin A1c  2. Essential hypertension Comments: Blood pressure is well-controlled.  Encouraged to continue modifying lifestyle changes. - CMP14 + Anion Gap  3. Stenosis of carotid artery, unspecified laterality Comments: Continue follow-up with cardiology.  Continue statin tolerating well  4. Vitamin D deficiency Will check vitamin D level and supplement as needed.    Also encouraged to spend 15 minutes in the sun daily.   5. Mixed hyperlipidemia Comments: Cholesterol levels have been  stable.  Continue statin - Lipid panel  6. Hidradenitis suppurativa Comments: He is now on Humira and doing well by the Dermatologist  7. Other long term (current) drug therapy - TSH     Patient was given opportunity to ask questions. Patient verbalized understanding of the plan and was able to repeat key elements of the plan. All questions were answered to their satisfaction.  [name], FNP   I, [name], FNP, have reviewed all documentation for this visit. The documentation on 01/28/23 for the exam, diagnosis, procedures, and orders are all accurate and complete.   IF YOU HAVE BEEN REFERRED TO A SPECIALIST, IT MAY TAKE 1-2 WEEKS TO SCHEDULE/PROCESS THE REFERRAL. IF YOU HAVE NOT HEARD FROM US/SPECIALIST IN TWO WEEKS, PLEASE GIVE [place] A CALL AT [phone].   THE PATIENT IS ENCOURAGED TO PRACTICE SOCIAL DISTANCING DUE TO THE COVID-19 PANDEMIC.

## 2023-01-29 LAB — LIPID PANEL
Chol/HDL Ratio: 3.7 ratio (ref 0.0–5.0)
Cholesterol, Total: 93 mg/dL — ABNORMAL LOW (ref 100–199)
HDL: 25 mg/dL — ABNORMAL LOW (ref >39)
LDL Chol Calc (NIH): 46 mg/dL (ref 0–99)
Triglycerides: 117 mg/dL (ref 0–149)
VLDL Cholesterol Cal: 22 mg/dL (ref 5–40)

## 2023-01-29 LAB — CMP14 + ANION GAP
ALT: 24 IU/L (ref 0–44)
AST: 16 IU/L (ref 0–40)
Albumin/Globulin Ratio: 1.7 (ref 1.2–2.2)
Albumin: 4.2 g/dL (ref 3.9–4.9)
Alkaline Phosphatase: 59 IU/L (ref 44–121)
Anion Gap: 16 mmol/L (ref 10.0–18.0)
BUN/Creatinine Ratio: 14 (ref 10–24)
BUN: 13 mg/dL (ref 8–27)
Bilirubin Total: 0.3 mg/dL (ref 0.0–1.2)
CO2: 20 mmol/L (ref 20–29)
Calcium: 9.3 mg/dL (ref 8.6–10.2)
Chloride: 103 mmol/L (ref 96–106)
Creatinine, Ser: 0.96 mg/dL (ref 0.76–1.27)
Globulin, Total: 2.5 g/dL (ref 1.5–4.5)
Glucose: 142 mg/dL — ABNORMAL HIGH (ref 70–99)
Potassium: 4.4 mmol/L (ref 3.5–5.2)
Sodium: 139 mmol/L (ref 134–144)
Total Protein: 6.7 g/dL (ref 6.0–8.5)
eGFR: 89 mL/min/{1.73_m2} (ref >59)

## 2023-01-29 LAB — TSH: TSH: 3.07 u[IU]/mL (ref 0.450–4.500)

## 2023-01-29 LAB — HEMOGLOBIN A1C
Est. average glucose Bld gHb Est-mCnc: 174 mg/dL
Hgb A1c MFr Bld: 7.7 % — ABNORMAL HIGH (ref 4.8–5.6)

## 2023-02-08 ENCOUNTER — Other Ambulatory Visit: Payer: Self-pay | Admitting: Nurse Practitioner

## 2023-02-08 DIAGNOSIS — G4700 Insomnia, unspecified: Secondary | ICD-10-CM

## 2023-02-09 NOTE — Telephone Encounter (Signed)
LOV 01/28/23 Next OV 06/02/23 Last refill 08/10/22, #30, 5 refills  Please review, thanks!

## 2023-02-10 ENCOUNTER — Ambulatory Visit (INDEPENDENT_AMBULATORY_CARE_PROVIDER_SITE_OTHER): Admitting: Podiatry

## 2023-02-10 ENCOUNTER — Encounter: Payer: Self-pay | Admitting: Podiatry

## 2023-02-10 VITALS — BP 160/84

## 2023-02-10 DIAGNOSIS — Z794 Long term (current) use of insulin: Secondary | ICD-10-CM

## 2023-02-10 DIAGNOSIS — B351 Tinea unguium: Secondary | ICD-10-CM

## 2023-02-10 DIAGNOSIS — E1165 Type 2 diabetes mellitus with hyperglycemia: Secondary | ICD-10-CM | POA: Diagnosis not present

## 2023-02-10 DIAGNOSIS — M79675 Pain in left toe(s): Secondary | ICD-10-CM

## 2023-02-10 DIAGNOSIS — M79674 Pain in right toe(s): Secondary | ICD-10-CM

## 2023-02-10 NOTE — Progress Notes (Signed)
  Subjective:  Patient ID: Stephen Sampson, male    DOB: 14-Nov-1961,  MRN: [id number]  Stephen Sampson presents to clinic today for preventative diabetic foot care and painful thick toenails that are difficult to trim. Pain interferes with ambulation. Aggravating factors include wearing enclosed shoe gear. Pain is relieved with periodic professional debridement.  Chief Complaint  Patient presents with   Nail Problem    DFC BS-did not check today A1C-7.7 PCP-Mo[place], [name] PCP VST- "2 weeks ago"   New problem(s): None.   PCP is [name], FNP.  Allergies  Allergen Reactions   Metformin And Related Diarrhea   Review of Systems: Negative except as noted in the HPI.  Objective: No changes noted in today's physical examination. Vitals:   02/10/23 0822  BP: (!) 160/84   Stephen Sampson is a pleasant 62 y.o. male morbidly obese in NAD. AAO x 3.  Vascular Examination: Vascular status intact b/l with palpable pedal pulses. CFT immediate b/l. No edema. No pain with calf compression b/l. Skin temperature gradient WNL b/l. Pedal hair sparse.  Neurological Examination: Sensation grossly intact b/l with 10 gram monofilament. Vibratory sensation intact b/l.   Dermatological Examination: Pedal skin with normal turgor, texture and tone b/l. Toenails 1-5 b/l thick, discolored, elongated with subungual debris and pain on dorsal palpation. No hyperkeratotic lesions noted b/l. Minimal callus formation submet head 5 bilaterally.  Musculoskeletal Examination: Muscle strength 5/5 to b/l LE. Hammertoe deformity noted 2-5 b/l. Patient ambulates independent of any assistive aids.  Radiographs: None  Assessment/Plan: 1. Pain due to onychomycosis of toenails of both feet   2. Controlled type 2 diabetes mellitus with hyperglycemia, with long-term current use of insulin ([place])     -Consent given for treatment as described below: -Examined patient. -Patient to continue soft,  supportive shoe gear daily. -Toenails 1-5 b/l were debrided in length and girth with sterile nail nippers and dremel without iatrogenic bleeding.  -Patient/POA to call should there be question/concern in the interim.   Return in about 3 months (around 05/11/2023).  Marzetta Board, DPM

## 2023-02-13 ENCOUNTER — Encounter: Payer: Self-pay | Admitting: Nurse Practitioner

## 2023-02-15 ENCOUNTER — Other Ambulatory Visit: Payer: Self-pay

## 2023-02-15 ENCOUNTER — Other Ambulatory Visit: Payer: Self-pay | Admitting: Nurse Practitioner

## 2023-02-15 DIAGNOSIS — G4700 Insomnia, unspecified: Secondary | ICD-10-CM

## 2023-02-15 MED ORDER — TEMAZEPAM 15 MG PO CAPS: ORAL_CAPSULE | ORAL | 5 refills | Status: DC

## 2023-02-18 ENCOUNTER — Other Ambulatory Visit: Payer: Self-pay | Admitting: Nurse Practitioner

## 2023-02-18 DIAGNOSIS — I10 Essential (primary) hypertension: Secondary | ICD-10-CM

## 2023-03-23 ENCOUNTER — Other Ambulatory Visit: Payer: Self-pay | Admitting: Nurse Practitioner

## 2023-03-23 DIAGNOSIS — I119 Hypertensive heart disease without heart failure: Secondary | ICD-10-CM

## 2023-04-13 ENCOUNTER — Ambulatory Visit (HOSPITAL_COMMUNITY): Attending: Cardiovascular Disease

## 2023-04-13 DIAGNOSIS — I350 Nonrheumatic aortic (valve) stenosis: Secondary | ICD-10-CM | POA: Insufficient documentation

## 2023-04-13 LAB — ECHOCARDIOGRAM COMPLETE
AR max vel: 1.18 cm2
AV Area VTI: 1.21 cm2
AV Area mean vel: 1.1 cm2
AV Mean grad: 29 mmHg
AV Peak grad: 47.3 mmHg
Ao pk vel: 3.44 m/s
Area-P 1/2: 3.5 cm2
S' Lateral: 3.2 cm

## 2023-04-25 NOTE — Progress Notes (Unsigned)
  Cardiology Office Note:   Date:  04/27/2023  ID:  Stephen Sampson, DOB 11-22-1961, MRN [id number]   History of Present Illness:   Stephen Sampson is a 62 y.o. male who presents for evaluation of aortic stenosis. Echo in March 2020 demonstrated moderate concentric left ventricular hypertrophy.  There was mild aortic valve stenosis. Cardiac cath from 2005 demonstrated an EF 60%.  There was no evidence of coronary artery disease.     Since I last saw him he lost his wife to parotid cancer.  He denies any new cardiovascular symptoms.  He had follow-up echocardiography last month.  His gradient was 29 mean for his aortic stenosis which was essentially the same as last year.  His ejection fraction was well-preserved.  There is no mention of LVH although this has been mentioned previously.  He is not having any new shortness of breath.  He pushes a Environmental consultant.  Denies any chest pressure, neck or arm discomfort.  He had no new palpitations, presyncope or syncope.  He had no PND or orthopnea.  ROS: As stated in the HPI and negative for all other systems.  Studies Reviewed:    EKG: Sinus rhythm, rate 66, axis within normal limits, intervals within normal limits, anterolateral T wave inversions unchanged from previous   Risk Assessment/Calculations:      Physical Exam:   VS:  BP (!) 158/68   Pulse 66   Ht 6' (1.829 m)   Wt 266 lb (120.7 kg)   SpO2 98%   BMI 36.08 kg/m    Wt Readings from Last 3 Encounters:  04/27/23 266 lb (120.7 kg)  01/28/23 260 lb (117.9 kg)  10/15/22 252 lb (114.3 kg)     GEN: Well nourished, well developed in no acute distress NECK: No JVD; No carotid bruits CARDIAC: RRR, no murmurs, rubs, gallops RESPIRATORY:  Clear to auscultation without rales, wheezing or rhonchi  ABDOMEN: Soft, non-tender, non-distended EXTREMITIES:  No edema; No deformity   ASSESSMENT AND PLAN:   Aortic stenosis: I am going to follow-up with an echocardiogram in 1 year.   Otherwise no change in therapy.   LVH: Likely related to aortic stenosis and hypertension.  However, I am going to look at a PYP scan.   HTN: His blood pressure is not controlled.  I am going to increase his Norvasc to 5 mg daily and his HCTZ to 25 mg daily.  He will keep a blood pressure diary.    DM: A1c was up to 7.7 but he has been stress eating.  We talked about this.    Tobacco abuse:   We again talked about the need to stop smoking.  He previously had tried Chantix.  I had previously had him call the one 800-number.      Signed, Rollene Rotunda, MD

## 2023-04-27 ENCOUNTER — Ambulatory Visit: Attending: Cardiology | Admitting: Cardiology

## 2023-04-27 ENCOUNTER — Encounter: Payer: Self-pay | Admitting: Cardiology

## 2023-04-27 VITALS — BP 158/68 | HR 66 | Ht 72.0 in | Wt 266.0 lb

## 2023-04-27 DIAGNOSIS — I119 Hypertensive heart disease without heart failure: Secondary | ICD-10-CM

## 2023-04-27 DIAGNOSIS — I10 Essential (primary) hypertension: Secondary | ICD-10-CM

## 2023-04-27 DIAGNOSIS — E118 Type 2 diabetes mellitus with unspecified complications: Secondary | ICD-10-CM | POA: Diagnosis not present

## 2023-04-27 DIAGNOSIS — I517 Cardiomegaly: Secondary | ICD-10-CM

## 2023-04-27 DIAGNOSIS — I350 Nonrheumatic aortic (valve) stenosis: Secondary | ICD-10-CM

## 2023-04-27 MED ORDER — AMLODIPINE BESYLATE 5 MG PO TABS
5.0000 mg | ORAL_TABLET | Freq: Every day | ORAL | 3 refills | Status: DC
Start: 2023-04-27 — End: 2023-06-02

## 2023-04-27 MED ORDER — HYDROCHLOROTHIAZIDE 25 MG PO TABS
25.0000 mg | ORAL_TABLET | Freq: Every day | ORAL | 3 refills | Status: DC
Start: 2023-04-27 — End: 2024-05-25

## 2023-04-27 NOTE — Patient Instructions (Signed)
Medication Instructions:   INCREASE AMLODIPINE TO 5 MG ONCE DAILY= 2 OF THE 2.5 MG TABLETS ONCE DAILY  INCREASE HCTZ TO 25 MG ONCE DAILY= 2 OF THE 12.5 MG TABLETS ONCE DAILY  *If you need a refill on your cardiac medications before your next appointment, please call your pharmacy  Testing/Procedures:  PYP NUCLEAR SCAN AT [address]   Follow-Up: At [facility], you and your health needs are our priority.  As part of our continuing mission to provide you with exceptional heart care, we have created designated Provider Care Teams.  These Care Teams include your primary Cardiologist (physician) and Advanced Practice Providers (APPs -  Physician Assistants and Nurse Practitioners) who all work together to provide you with the care you need, when you need it.  We recommend signing up for the patient portal called "MyChart".  Sign up information is provided on this After Visit Summary.  MyChart is used to connect with patients for Virtual Visits (Telemedicine).  Patients are able to view lab/test results, encounter notes, upcoming appointments, etc.  Non-urgent messages can be sent to your provider as well.   To learn more about what you can do with MyChart, go to [email or web address].    Your next appointment:   12 month(s)  Provider:   Rollene Rotunda, MD

## 2023-05-02 ENCOUNTER — Encounter: Payer: Self-pay | Admitting: Nurse Practitioner

## 2023-05-05 ENCOUNTER — Telehealth (HOSPITAL_COMMUNITY): Payer: Self-pay | Admitting: *Deleted

## 2023-05-05 NOTE — Telephone Encounter (Signed)
Called patient to remind of upcoming Amyloid Scan.Stephen Sampson

## 2023-05-10 ENCOUNTER — Other Ambulatory Visit: Payer: Self-pay | Admitting: Nurse Practitioner

## 2023-05-10 MED ORDER — INSULIN LISPRO (1 UNIT DIAL) 100 UNIT/ML (KWIKPEN): PEN_INJECTOR | SUBCUTANEOUS | 3 refills | Status: DC

## 2023-05-12 ENCOUNTER — Ambulatory Visit (HOSPITAL_COMMUNITY): Attending: Cardiology

## 2023-05-12 DIAGNOSIS — I517 Cardiomegaly: Secondary | ICD-10-CM

## 2023-05-12 MED ORDER — TECHNETIUM TC 99M PYROPHOSPHATE
20.9000 | Freq: Once | INTRAVENOUS | Status: AC
Start: 2023-05-12 — End: 2023-05-12
  Administered 2023-05-12: 20.9 via INTRAVENOUS

## 2023-05-13 LAB — MYOCARDIAL AMYLOID PLANAR & SPECT: H/CL Ratio: 1.02

## 2023-05-14 ENCOUNTER — Other Ambulatory Visit: Payer: Self-pay | Admitting: Nurse Practitioner

## 2023-05-18 ENCOUNTER — Encounter: Payer: Self-pay | Admitting: *Deleted

## 2023-05-18 ENCOUNTER — Other Ambulatory Visit: Payer: Self-pay | Admitting: *Deleted

## 2023-05-18 ENCOUNTER — Encounter: Payer: Self-pay | Admitting: Cardiology

## 2023-05-18 DIAGNOSIS — I517 Cardiomegaly: Secondary | ICD-10-CM

## 2023-05-18 NOTE — Progress Notes (Signed)
card

## 2023-05-18 NOTE — Telephone Encounter (Signed)
Follow Up:      Patient is retuning Debra's call.

## 2023-05-19 NOTE — Telephone Encounter (Signed)
This encounter was created in error - please disregard.

## 2023-05-20 ENCOUNTER — Other Ambulatory Visit: Payer: Self-pay | Admitting: Nurse Practitioner

## 2023-05-20 DIAGNOSIS — E1169 Type 2 diabetes mellitus with other specified complication: Secondary | ICD-10-CM

## 2023-05-20 MED ORDER — TRULICITY 1.5 MG/0.5ML ~~LOC~~ SOAJ: 1.5000 mg | SUBCUTANEOUS | 3 refills | Status: DC

## 2023-05-24 ENCOUNTER — Other Ambulatory Visit: Payer: Self-pay

## 2023-05-24 MED ORDER — INSULIN LISPRO (1 UNIT DIAL) 100 UNIT/ML (KWIKPEN): PEN_INJECTOR | SUBCUTANEOUS | 3 refills | Status: DC

## 2023-06-01 ENCOUNTER — Ambulatory Visit (INDEPENDENT_AMBULATORY_CARE_PROVIDER_SITE_OTHER): Admitting: Podiatry

## 2023-06-01 ENCOUNTER — Encounter: Payer: Self-pay | Admitting: Podiatry

## 2023-06-01 VITALS — BP 140/71 | HR 72 | Temp 97.7°F

## 2023-06-01 DIAGNOSIS — E1165 Type 2 diabetes mellitus with hyperglycemia: Secondary | ICD-10-CM | POA: Diagnosis not present

## 2023-06-01 DIAGNOSIS — M79675 Pain in left toe(s): Secondary | ICD-10-CM

## 2023-06-01 DIAGNOSIS — B351 Tinea unguium: Secondary | ICD-10-CM

## 2023-06-01 DIAGNOSIS — M79674 Pain in right toe(s): Secondary | ICD-10-CM

## 2023-06-01 DIAGNOSIS — Z794 Long term (current) use of insulin: Secondary | ICD-10-CM

## 2023-06-01 NOTE — Progress Notes (Signed)
  Subjective:  Patient ID: Stephen Sampson, male    DOB: 06-29-1961,  MRN: [id number]  Stephen Sampson presents to clinic today for preventative diabetic foot care and painful elongated mycotic toenails 1-5 bilaterally which are tender when wearing enclosed shoe gear. Pain is relieved with periodic professional debridement. Chief Complaint  Patient presents with   Routine Foot Care     3 Month RFC    New problem(s): None.   PCP is [name], FNP.  Allergies  Allergen Reactions   Metformin And Related Diarrhea   Review of Systems: Negative except as noted in the HPI. Objective:   Vitals:   06/01/23 0829  BP: (!) 140/71  Pulse: 72  Temp: 97.7 F (36.5 C)  SpO2: 96%   Constitutional Stephen Sampson is a pleasant 62 y.o. male, morbidly obese in NAD. AAO x 3.   Vascular Vascular Examination: Capillary refill time immediate b/l. Vascular status intact b/l with palpable pedal pulses. Pedal hair decreased. No edema. No pain with calf compression b/l. Skin temperature gradient WNL b/l. No cyanosis or clubbing b/l.   Neurological Examination: Sensation grossly intact b/l with 10 gram monofilament. Vibratory sensation intact b/l.   Dermatological Examination: Pedal skin with normal turgor, texture and tone b/l.  No open wounds. No interdigital macerations.   Toenails 1-5 b/l thick, discolored, elongated with subungual debris and pain on dorsal palpation.   Musculoskeletal Examination: Muscle strength 5/5 to all lower extremity muscle groups bilaterally. Hammertoe deformity noted 2-5 b/l.  Radiographs: None  Last A1c:      Latest Ref Rng & Units 01/28/2023    9:18 AM 10/15/2022   10:25 AM 07/16/2022    9:16 AM  Hemoglobin A1C  Hemoglobin-A1c 4.8 - 5.6 % 7.7  7.5  7.9        Assessment:   1. Pain due to onychomycosis of toenails of both feet   2. Controlled type 2 diabetes mellitus with hyperglycemia, with long-term current use of insulin (HCC)    Plan:   Patient was evaluated and treated and all questions answered. Consent given for treatment as described below: -Patient to continue soft, supportive shoe gear daily. -Toenails 1-5 b/l were debrided in length and girth with sterile nail nippers and dremel without iatrogenic bleeding.  -Patient/POA to call should there be question/concern in the interim.  Return in about 3 months (around 09/01/2023).  Freddie Breech, DPM

## 2023-06-02 ENCOUNTER — Ambulatory Visit: Admitting: Nurse Practitioner

## 2023-06-02 ENCOUNTER — Encounter: Payer: Self-pay | Admitting: Nurse Practitioner

## 2023-06-02 VITALS — BP 130/74 | HR 64 | Temp 97.9°F | Ht 72.0 in | Wt 262.6 lb

## 2023-06-02 DIAGNOSIS — Z6835 Body mass index (BMI) 35.0-35.9, adult: Secondary | ICD-10-CM

## 2023-06-02 DIAGNOSIS — E6609 Other obesity due to excess calories: Secondary | ICD-10-CM

## 2023-06-02 DIAGNOSIS — E1169 Type 2 diabetes mellitus with other specified complication: Secondary | ICD-10-CM | POA: Diagnosis not present

## 2023-06-02 DIAGNOSIS — E559 Vitamin D deficiency, unspecified: Secondary | ICD-10-CM

## 2023-06-02 DIAGNOSIS — Z794 Long term (current) use of insulin: Secondary | ICD-10-CM

## 2023-06-02 DIAGNOSIS — Z720 Tobacco use: Secondary | ICD-10-CM

## 2023-06-02 DIAGNOSIS — I10 Essential (primary) hypertension: Secondary | ICD-10-CM

## 2023-06-02 DIAGNOSIS — I350 Nonrheumatic aortic (valve) stenosis: Secondary | ICD-10-CM

## 2023-06-02 DIAGNOSIS — I6529 Occlusion and stenosis of unspecified carotid artery: Secondary | ICD-10-CM

## 2023-06-02 DIAGNOSIS — E782 Mixed hyperlipidemia: Secondary | ICD-10-CM

## 2023-06-02 MED ORDER — SEMAGLUTIDE(0.25 OR 0.5MG/DOS) 2 MG/3ML ~~LOC~~ SOPN
0.5000 mg | PEN_INJECTOR | SUBCUTANEOUS | 1 refills | Status: DC
Start: 2023-06-02 — End: 2023-10-04

## 2023-06-02 NOTE — Progress Notes (Signed)
I,Cauy Melody,acting as a Neurosurgeon for [name], FNP.,have documented all relevant documentation on the behalf of [name], FNP,as directed by  [name], FNP while in the presence of [name], FNP.  Subjective:  Patient ID: Stephen Sampson , male    DOB: 07-Jan-1961 , 62 y.o.   MRN: [id number]  Chief Complaint  Patient presents with   Hypertension   Diabetes    HPI  Patient presents today for a BP, DM, and Chol check. Patient reports compliance with medications and has no other concerns today. Patient denies any chest pain, SOB, and headaches. He has lispro and the mail order came in as well. His most recent Cardiology appt increased the amlodipine to 5mg  and HCTZ to 25 mg. He is having a test for amloidosisys - has a heart CT scan July.  BS has been high 100's and low 200s premeal.    He has had his upper teeth (4 inside teeth) he is getting implants later this month  BP Readings from Last 3 Encounters: 06/02/23 : 130/74 06/01/23 : (!) 140/71 04/27/23 : (!) 158/68    Hypertension This is a chronic problem. The current episode started more than 1 year ago. The problem has been gradually improving since onset. The problem is uncontrolled. Pertinent negatives include no anxiety, blurred vision, chest pain, headaches or palpitations. There are no known risk factors for coronary artery disease. Past treatments include angiotensin blockers and alpha 1 blockers. There are no compliance problems.  There is no history of angina. There is no history of chronic renal disease.  Diabetes He presents for his follow-up diabetic visit. He has type 2 diabetes mellitus. His disease course has been improving. Pertinent negatives for hypoglycemia include no dizziness or headaches. Pertinent negatives for diabetes include no blurred vision, no chest pain and no fatigue. There are no hypoglycemic complications. Symptoms are stable. There are no diabetic complications. Risk factors for coronary  artery disease include obesity and sedentary lifestyle. Current diabetic treatment includes oral agent (dual therapy). He is compliant with treatment all of the time. His weight is increasing steadily. He is following a generally healthy diet. When asked about meal planning, he reported none. He has not had a previous visit with a dietitian. He participates in exercise intermittently. (Blood sugar this morning was 135. ) An ACE inhibitor/angiotensin II receptor blocker is being taken. He does not see a podiatrist.Eye exam is current (12/18/2021 last appt).     Past Medical History:  Diagnosis Date   Diabetes mellitus without complication (HCC)    Hidradenitis    Hypertension      Family History  Problem Relation Age of Onset   Sudden death Mother        Possible MI   Dementia Father      Current Outpatient Medications:    amLODipine (NORVASC) 10 MG tablet, Take 10 mg by mouth daily., Disp: , Rfl:    aspirin EC 81 MG tablet, Take 81 mg by mouth daily., Disp: , Rfl:    atorvastatin (LIPITOR) 40 MG tablet, TAKE 1 TABLET EVERY EVENING, Disp: 90 tablet, Rfl: 3   B-D UF III MINI PEN NEEDLES 31G X 5 MM MISC, USE AS DIRECTED, Disp: 100 each, Rfl: 3   clindamycin (CLEOCIN T) 1 % lotion, Apply  as directed to affected area twice a day, Disp: , Rfl:    glucose blood (FREESTYLE LITE) test strip, USE AS INSTRUCTED, Disp: 300 strip, Rfl: 3   HUMIRA, 2 PEN, 80  MG/0.8ML PNKT, Inject 1 each as directed every 14 (fourteen) days., Disp: , Rfl:    hydrochlorothiazide (HYDRODIURIL) 25 MG tablet, Take 1 tablet (25 mg total) by mouth daily., Disp: 90 tablet, Rfl: 3   insulin lispro (HUMALOG) 100 UNIT/ML KwikPen, INJECT UNDER THE SKIN BEFORE MEALS AS PER INSULIN SLIDING SCALE PROTOCOL MAX DOSE 30 UNITS PER INJECTION, Disp: 45 mL, Rfl: 3   JANUMET 50-1000 MG tablet, TAKE 1 TABLET TWICE A DAY, Disp: 180 tablet, Rfl: 3   LANTUS SOLOSTAR 100 UNIT/ML Solostar Pen, INJECT 50 UNITS UNDER THE SKIN AT BEDTIME (Patient  taking differently: 48 Units.), Disp: 135 mL, Rfl: 3   Semaglutide,0.25 or 0.5MG /DOS, 2 MG/3ML SOPN, Inject 0.5 mg into the skin once a week., Disp: 9 mL, Rfl: 1   temazepam (RESTORIL) 15 MG capsule, TAKE ONE CAPSULE BY MOUTH EVERY NIGHT AT BEDTIME AS NEEDED FOR SLEEP, Disp: 30 capsule, Rfl: 5   valsartan (DIOVAN) 320 MG tablet, TAKE 1 TABLET DAILY, Disp: 90 tablet, Rfl: 3   Vitamin D, Ergocalciferol, (DRISDOL) 1.25 MG (50000 UNIT) CAPS capsule, TAKE 1 CAPSULE TWO TIMES A WEEK, Disp: 24 capsule, Rfl: 3   Allergies  Allergen Reactions   Metformin And Related Diarrhea     Review of Systems  Constitutional: Negative.  Negative for fatigue.  Eyes:  Negative for blurred vision.  Respiratory: Negative.    Cardiovascular:  Negative for chest pain, palpitations and leg swelling.  Gastrointestinal:  Negative for abdominal distention, abdominal pain, constipation, diarrhea, nausea, rectal pain and vomiting.  Neurological:  Negative for dizziness and headaches.  Psychiatric/Behavioral: Negative.       Today's Vitals   06/02/23 0834  BP: 130/74  Pulse: 64  Temp: 97.9 F (36.6 C)  TempSrc: Oral  Weight: 262 lb 9.6 oz (119.1 kg)  Height: 6' (1.829 m)  PainSc: 0-No pain   Body mass index is 35.61 kg/m.  Wt Readings from Last 3 Encounters:  06/02/23 262 lb 9.6 oz (119.1 kg)  05/12/23 266 lb (120.7 kg)  04/27/23 266 lb (120.7 kg)    The ASCVD Risk score (Arnett DK, et al., 2019) failed to calculate for the following reasons:   The valid total cholesterol range is 130 to 320 mg/dL  Objective:  Physical Exam Vitals reviewed.  Constitutional:      General: He is not in acute distress.    Appearance: Normal appearance. He is obese.  Cardiovascular:     Rate and Rhythm: Normal rate and regular rhythm.     Pulses: Normal pulses.     Heart sounds: Murmur heard.  Pulmonary:     Effort: Pulmonary effort is normal. No respiratory distress.     Breath sounds: Normal breath sounds. No  wheezing.  Skin:    General: Skin is warm and dry.     Capillary Refill: Capillary refill takes less than 2 seconds.  Neurological:     General: No focal deficit present.     Mental Status: He is alert and oriented to person, place, and time.     Cranial Nerves: No cranial nerve deficit.     Motor: No weakness.         Assessment And Plan:  1. Essential hypertension Comments: Blood pressure is improved since recent increase in amlodipine and HCTZ - Basic metabolic panel  2. Type 2 diabetes mellitus with other specified complication, with long-term current use of insulin (HCC) Comments: Given sample of Ozempic due to unavailability of trulicty and moun[facility]o at [facility] -  Hemoglobin A1c - Semaglutide,0.25 or 0.5MG /DOS, 2 MG/3ML SOPN; Inject 0.5 mg into the skin once a week.  Dispense: 9 mL; Refill: 1  3. Mixed hyperlipidemia Comments: Cholesterol levels have been stable, continue statin. Tolerating well. - Lipid panel  4. Vitamin D deficiency Will check vitamin D level and supplement as needed.    Also encouraged to spend 15 minutes in the sun daily.   5. Stenosis of carotid artery, unspecified laterality Comments: Continue f/u with Cardiology  6. Nonrheumatic aortic valve stenosis Comments: Continue f/u with Cardiology  7. Class 2 obesity due to excess calories with body mass index (BMI) of 35.0 to 35.9 in adult, unspecified whether serious comorbidity present He is encouraged to strive for BMI less than 30 to decrease cardiac risk. Advised to aim for at least 150 minutes of exercise per week.  8. Tobacco abuse Smoking cessation instruction/counseling given:  counseled patient on the dangers of tobacco use, advised patient to stop smoking, and reviewed strategies to maximize success Ordered low dose CT at [facility] - CT CHEST LUNG CA SCREEN LOW DOSE W/O CM; Future  Return for controlled DM check 4 months.   Patient was given opportunity to ask questions. Patient  verbalized understanding of the plan and was able to repeat key elements of the plan. All questions were answered to their satisfaction.  [name], FNP  I, [name], FNP, have reviewed all documentation for this visit. The documentation on 06/02/23 for the exam, diagnosis, procedures, and orders are all accurate and complete.   IF YOU HAVE BEEN REFERRED TO A SPECIALIST, IT MAY TAKE 1-2 WEEKS TO SCHEDULE/PROCESS THE REFERRAL. IF YOU HAVE NOT HEARD FROM US/SPECIALIST IN TWO WEEKS, PLEASE GIVE [place] A CALL AT [phone].

## 2023-06-02 NOTE — Patient Instructions (Signed)
Hypertension, Adult Hypertension is another name for high blood pressure. High blood pressure forces your heart to work harder to pump blood. This can cause problems over time. There are two numbers in a blood pressure reading. There is a top number (systolic) over a bottom number (diastolic). It is best to have a blood pressure that is below 120/80. What are the causes? The cause of this condition is not known. Some other conditions can lead to high blood pressure. What increases the risk? Some lifestyle factors can make you more likely to develop high blood pressure: Smoking. Not getting enough exercise or physical activity. Being overweight. Having too much fat, sugar, calories, or salt (sodium) in your diet. Drinking too much alcohol. Other risk factors include: Having any of these conditions: Heart disease. Diabetes. High cholesterol. Kidney disease. Obstructive sleep apnea. Having a family history of high blood pressure and high cholesterol. Age. The risk increases with age. Stress. What are the signs or symptoms? High blood pressure may not cause symptoms. Very high blood pressure (hypertensive crisis) may cause: Headache. Fast or uneven heartbeats (palpitations). Shortness of breath. Nosebleed. Vomiting or feeling like you may vomit (nauseous). Changes in how you see. Very bad chest pain. Feeling dizzy. Seizures. How is this treated? This condition is treated by making healthy lifestyle changes, such as: Eating healthy foods. Exercising more. Drinking less alcohol. Your doctor may prescribe medicine if lifestyle changes do not help enough and if: Your top number is above 130. Your bottom number is above 80. Your personal target blood pressure may vary. Follow these instructions at home: Eating and drinking  If told, follow the DASH eating plan. To follow this plan: Fill one half of your plate at each meal with fruits and vegetables. Fill one fourth of your plate  at each meal with whole grains. Whole grains include whole-wheat pasta, brown rice, and whole-grain bread. Eat or drink low-fat dairy products, such as skim milk or low-fat yogurt. Fill one fourth of your plate at each meal with low-fat (lean) proteins. Low-fat proteins include fish, chicken without skin, eggs, beans, and tofu. Avoid fatty meat, cured and processed meat, or chicken with skin. Avoid pre-made or processed food. Limit the amount of salt in your diet to less than 1,500 mg each day. Do not drink alcohol if: Your doctor tells you not to drink. You are pregnant, may be pregnant, or are planning to become pregnant. If you drink alcohol: Limit how much you have to: 0-1 drink a day for women. 0-2 drinks a day for men. Know how much alcohol is in your drink. In the [place] one drink equals one 12 oz bottle of beer (355 mL), one 5 oz glass of wine (148 mL), or one 1 oz glass of hard liquor (44 mL). Lifestyle  Work with your doctor to stay at a healthy weight or to lose weight. Ask your doctor what the best weight is for you. Get at least 30 minutes of exercise that causes your heart to beat faster (aerobic exercise) most days of the week. This may include walking, swimming, or biking. Get at least 30 minutes of exercise that strengthens your muscles (resistance exercise) at least 3 days a week. This may include lifting weights or doing Pilates. Do not smoke or use any products that contain nicotine or tobacco. If you need help quitting, ask your doctor. Check your blood pressure at home as told by your doctor. Keep all follow-up visits. Medicines Take over-the-counter and prescription medicines   only as told by your doctor. Follow directions carefully. Do not skip doses of blood pressure medicine. The medicine does not work as well if you skip doses. Skipping doses also puts you at risk for problems. Ask your doctor about side effects or reactions to medicines that you should watch  for. Contact a doctor if: You think you are having a reaction to the medicine you are taking. You have headaches that keep coming back. You feel dizzy. You have swelling in your ankles. You have trouble with your vision. Get help right away if: You get a very bad headache. You start to feel mixed up (confused). You feel weak or numb. You feel faint. You have very bad pain in your: Chest. Belly (abdomen). You vomit more than once. You have trouble breathing. These symptoms may be an emergency. Get help right away. Call 911. Do not wait to see if the symptoms will go away. Do not drive yourself to the hospital. Summary Hypertension is another name for high blood pressure. High blood pressure forces your heart to work harder to pump blood. For most people, a normal blood pressure is less than 120/80. Making healthy choices can help lower blood pressure. If your blood pressure does not get lower with healthy choices, you may need to take medicine. This information is not intended to replace advice given to you by your health care provider. Make sure you discuss any questions you have with your health care provider. Document Revised: 09/25/2021 Document Reviewed: 09/25/2021 Elsevier Patient Education  2024 Elsevier Inc.  

## 2023-06-03 LAB — LIPID PANEL
Chol/HDL Ratio: 3.9 ratio (ref 0.0–5.0)
Cholesterol, Total: 98 mg/dL — ABNORMAL LOW (ref 100–199)
HDL: 25 mg/dL — ABNORMAL LOW (ref >39)
LDL Chol Calc (NIH): 50 mg/dL (ref 0–99)
Triglycerides: 127 mg/dL (ref 0–149)
VLDL Cholesterol Cal: 23 mg/dL (ref 5–40)

## 2023-06-03 LAB — BASIC METABOLIC PANEL
BUN/Creatinine Ratio: 11 (ref 10–24)
BUN: 12 mg/dL (ref 8–27)
CO2: 20 mmol/L (ref 20–29)
Calcium: 9.3 mg/dL (ref 8.6–10.2)
Chloride: 103 mmol/L (ref 96–106)
Creatinine, Ser: 1.11 mg/dL (ref 0.76–1.27)
Glucose: 215 mg/dL — ABNORMAL HIGH (ref 70–99)
Potassium: 4.3 mmol/L (ref 3.5–5.2)
Sodium: 137 mmol/L (ref 134–144)
eGFR: 75 mL/min/{1.73_m2} (ref >59)

## 2023-06-03 LAB — HEMOGLOBIN A1C
Est. average glucose Bld gHb Est-mCnc: 212 mg/dL
Hgb A1c MFr Bld: 9 % — ABNORMAL HIGH (ref 4.8–5.6)

## 2023-06-11 ENCOUNTER — Encounter: Payer: Self-pay | Admitting: Cardiology

## 2023-06-24 ENCOUNTER — Other Ambulatory Visit: Payer: Self-pay | Admitting: Nurse Practitioner

## 2023-07-07 ENCOUNTER — Other Ambulatory Visit: Payer: Self-pay | Admitting: Cardiology

## 2023-07-07 ENCOUNTER — Ambulatory Visit (HOSPITAL_COMMUNITY)
Admission: RE | Admit: 2023-07-07 | Discharge: 2023-07-07 | Disposition: A | Discharge status: Home / Self Care | Source: Ambulatory Visit | Attending: Cardiology | Admitting: Cardiology

## 2023-07-07 DIAGNOSIS — I517 Cardiomegaly: Secondary | ICD-10-CM

## 2023-07-07 MED ORDER — GADOBUTROL 1 MMOL/ML IV SOLN
10.0000 mL | Freq: Once | INTRAVENOUS | Status: AC | PRN
  Administered 2023-07-07: 10 mL via INTRAVENOUS

## 2023-07-25 ENCOUNTER — Other Ambulatory Visit: Payer: Self-pay | Admitting: Nurse Practitioner

## 2023-08-10 ENCOUNTER — Encounter: Payer: Self-pay | Admitting: Nurse Practitioner

## 2023-08-11 ENCOUNTER — Other Ambulatory Visit: Payer: Self-pay | Admitting: Nurse Practitioner

## 2023-08-11 DIAGNOSIS — G4700 Insomnia, unspecified: Secondary | ICD-10-CM

## 2023-09-01 ENCOUNTER — Other Ambulatory Visit: Payer: Self-pay | Admitting: Nurse Practitioner

## 2023-09-01 DIAGNOSIS — E559 Vitamin D deficiency, unspecified: Secondary | ICD-10-CM

## 2023-09-06 ENCOUNTER — Ambulatory Visit: Admitting: Podiatry

## 2023-09-08 ENCOUNTER — Encounter: Payer: Self-pay | Admitting: Nurse Practitioner

## 2023-09-27 ENCOUNTER — Ambulatory Visit (INDEPENDENT_AMBULATORY_CARE_PROVIDER_SITE_OTHER): Admitting: Podiatry

## 2023-09-27 DIAGNOSIS — B351 Tinea unguium: Secondary | ICD-10-CM

## 2023-09-27 DIAGNOSIS — M79675 Pain in left toe(s): Secondary | ICD-10-CM | POA: Diagnosis not present

## 2023-09-27 DIAGNOSIS — M79674 Pain in right toe(s): Secondary | ICD-10-CM | POA: Diagnosis not present

## 2023-09-27 NOTE — Progress Notes (Signed)
       Subjective:  Patient ID: Stephen Sampson, male    DOB: [birth year],  MRN: [id number]   Stephen Sampson presents to clinic today for:  Chief Complaint  Patient presents with   Nail Problem    Diabetic foot check and nail trim.  Has neuropathy- not on gabapentin or lyrica.   No symptoms of claudication reported.  Primary care physician is Stephen Felts, Stephen Sampson at [facility]- he will se her next week.   Last HgA1c 9.0 on 06/02/23   Patient notes nails are thick, discolored, elongated and painful in shoegear when trying to ambulate.  His HbA1c has risen from 7.7 to 9.0 this year  PCP is Stephen Felts, Stephen Sampson.  Past Medical History:  Diagnosis Date   Diabetes mellitus without complication (HCC)    Hidradenitis    Hypertension     Past Surgical History:  Procedure Laterality Date   CARDIAC CATHETERIZATION      Allergies  Allergen Reactions   Metformin And Related Diarrhea   Review of Systems: Negative except as noted in the HPI.  Objective:  Stephen Sampson is a pleasant 62 y.o. male in NAD. AAO x 3.  Vascular Examination: Capillary refill time is 3-5 seconds to toes bilateral. Palpable pedal pulses b/l LE. Digital hair present b/l.  Skin temperature gradient WNL b/l. No varicosities b/l. No cyanosis noted b/l.   Dermatological Examination: Pedal skin with normal turgor, texture and tone b/l. No open wounds. No interdigital macerations b/l. Toenails x10 are 3mm thick, discolored, dystrophic with subungual debris. There is pain with compression of the nail plates.  They are elongated x10     Latest Ref Rng & Units 06/02/2023    9:07 AM 01/28/2023    9:18 AM 10/15/2022   10:25 AM  Hemoglobin A1C  Hemoglobin-A1c 4.8 - 5.6 % 9.0  7.7  7.5    Assessment/Plan: 1. Pain due to onychomycosis of toenails of both feet    The mycotic toenails were sharply debrided x10 with sterile nail nippers and a power debriding burr to decrease bulk/thickness and length.   Patient thinks his next appointment is already scheduled with Dr. Dolores Patty.  Return in about 3 months (around 12/28/2023) for [facility].   Clerance Lav, DPM, FACFAS [facility]     [address] [place], [place] [address]                Office [phone]  Fax [phone]

## 2023-10-04 ENCOUNTER — Ambulatory Visit: Admitting: Nurse Practitioner

## 2023-10-04 ENCOUNTER — Encounter: Payer: Self-pay | Admitting: Nurse Practitioner

## 2023-10-04 VITALS — BP 110/70 | HR 72 | Temp 98.4°F | Ht 72.0 in | Wt 262.0 lb

## 2023-10-04 DIAGNOSIS — E559 Vitamin D deficiency, unspecified: Secondary | ICD-10-CM

## 2023-10-04 DIAGNOSIS — E1169 Type 2 diabetes mellitus with other specified complication: Secondary | ICD-10-CM

## 2023-10-04 DIAGNOSIS — I10 Essential (primary) hypertension: Secondary | ICD-10-CM

## 2023-10-04 DIAGNOSIS — E6609 Other obesity due to excess calories: Secondary | ICD-10-CM

## 2023-10-04 DIAGNOSIS — Z6835 Body mass index (BMI) 35.0-35.9, adult: Secondary | ICD-10-CM

## 2023-10-04 DIAGNOSIS — E782 Mixed hyperlipidemia: Secondary | ICD-10-CM | POA: Diagnosis not present

## 2023-10-04 DIAGNOSIS — E66812 Obesity, class 2: Secondary | ICD-10-CM

## 2023-10-04 DIAGNOSIS — E1165 Type 2 diabetes mellitus with hyperglycemia: Secondary | ICD-10-CM

## 2023-10-04 DIAGNOSIS — Z79899 Other long term (current) drug therapy: Secondary | ICD-10-CM

## 2023-10-04 MED ORDER — EMPAGLIFLOZIN 25 MG PO TABS
25.0000 mg | ORAL_TABLET | Freq: Every day | ORAL | 1 refills | Status: DC
Start: 2023-10-04 — End: 2024-03-23

## 2023-10-04 MED ORDER — SEMAGLUTIDE (1 MG/DOSE) 4 MG/3ML ~~LOC~~ SOPN
1.0000 mg | PEN_INJECTOR | SUBCUTANEOUS | 1 refills | Status: DC
Start: 2023-10-04 — End: 2024-02-08

## 2023-10-04 NOTE — Assessment & Plan Note (Deleted)
Will change janumet to jardiance and increasing the dose of Ozempic.  He will have better cardioprotection with Jardiance.  Hemoglobin A1c was increased at last visit.

## 2023-10-04 NOTE — Progress Notes (Signed)
Madelaine Bhat, CMA,acting as a Neurosurgeon for Stephen Felts, FNP.,have documented all relevant documentation on the behalf of Stephen Felts, FNP,as directed by  Stephen Felts, FNP while in the presence of Stephen Felts, FNP.  Subjective:  Patient ID: Stephen Sampson , male    DOB: Jun 10, 1961 , 62 y.o.   MRN: [id number]  Chief Complaint  Patient presents with   Hypertension    HPI  Patient presents today for a bp, chol, and dm follow up, Patient reports compliance with medication. Patient denies any chest pain, SOB, or headaches. Patient has no concerns today. He is trying to get out and walk more. He is recently retired as of 2 weeks ago. He feels his stress has reduced since retiring.  BP Readings from Last 3 Encounters: 10/04/23 : 110/70 06/02/23 : 130/74 06/01/23 : (!) 140/71    Hypertension This is a chronic problem. The current episode started more than 1 year ago. The problem has been gradually improving since onset. The problem is uncontrolled. Pertinent negatives include no anxiety, blurred vision, chest pain, headaches or palpitations. There are no known risk factors for coronary artery disease. Past treatments include angiotensin blockers and alpha 1 blockers. There are no compliance problems.  There is no history of angina. There is no history of chronic renal disease.  Diabetes He presents for his follow-up diabetic visit. He has type 2 diabetes mellitus. His disease course has been improving. Pertinent negatives for hypoglycemia include no dizziness or headaches. Pertinent negatives for diabetes include no blurred vision, no chest pain and no fatigue. There are no hypoglycemic complications. Symptoms are stable. There are no diabetic complications. Risk factors for coronary artery disease include obesity and sedentary lifestyle. Current diabetic treatment includes oral agent (dual therapy). He is compliant with treatment all of the time. His weight is increasing steadily. He is  following a generally healthy diet. When asked about meal planning, he reported none. He has not had a previous visit with a dietitian. He participates in exercise intermittently. (Blood sugar ranging 142-200's (low). ) An ACE inhibitor/angiotensin II receptor blocker is being taken. He does not see a podiatrist.Eye exam is current (12/18/2021 last appt).     Past Medical History:  Diagnosis Date   Diabetes mellitus without complication (HCC)    Hidradenitis    Hypertension      Family History  Problem Relation Age of Onset   Sudden death Mother        Possible MI   Dementia Father      Current Outpatient Medications:    amLODipine (NORVASC) 10 MG tablet, Take 10 mg by mouth daily., Disp: , Rfl:    aspirin EC 81 MG tablet, Take 81 mg by mouth daily., Disp: , Rfl:    atorvastatin (LIPITOR) 40 MG tablet, TAKE 1 TABLET EVERY EVENING, Disp: 90 tablet, Rfl: 3   B-D UF III MINI PEN NEEDLES 31G X 5 MM MISC, USE AS DIRECTED, Disp: 100 each, Rfl: 3   clindamycin (CLEOCIN T) 1 % lotion, Apply  as directed to affected area twice a day, Disp: , Rfl:    empagliflozin (JARDIANCE) 25 MG TABS tablet, Take 1 tablet (25 mg total) by mouth daily before breakfast., Disp: 90 tablet, Rfl: 1   glucose blood (FREESTYLE LITE) test strip, USE AS INSTRUCTED, Disp: 300 strip, Rfl: 3   HUMIRA, 2 PEN, 80 MG/0.8ML PNKT, Inject 1 each as directed every 14 (fourteen) days., Disp: , Rfl:    hydrochlorothiazide (HYDRODIURIL) 25 MG tablet,  Take 1 tablet (25 mg total) by mouth daily., Disp: 90 tablet, Rfl: 3   insulin lispro (HUMALOG) 100 UNIT/ML KwikPen, INJECT UNDER THE SKIN BEFORE MEALS AS PER INSULIN SLIDING SCALE PROTOCOL MAX DOSE 30 UNITS PER INJECTION, Disp: 45 mL, Rfl: 3   Semaglutide, 1 MG/DOSE, 4 MG/3ML SOPN, Inject 1 mg into the skin once a week., Disp: 9 mL, Rfl: 1   temazepam (RESTORIL) 15 MG capsule, TAKE 1 CAPSULE BY MOUTH EVERY NIGHT AT BEDTIME AS NEEDED FOR SLEEP, Disp: 30 capsule, Rfl: 5   valsartan  (DIOVAN) 320 MG tablet, TAKE 1 TABLET DAILY, Disp: 90 tablet, Rfl: 3   Vitamin D, Ergocalciferol, (DRISDOL) 1.25 MG (50000 UNIT) CAPS capsule, TAKE 1 CAPSULE TWICE A WEEK, Disp: 24 capsule, Rfl: 3   LANTUS SOLOSTAR 100 UNIT/ML Solostar Pen, INJECT 50 UNITS UNDER THE SKIN AT BEDTIME (Patient not taking: Reported on 10/04/2023), Disp: 135 mL, Rfl: 3   Allergies  Allergen Reactions   Metformin And Related Diarrhea     Review of Systems  Constitutional: Negative.  Negative for fatigue.  HENT: Negative.    Eyes: Negative.  Negative for blurred vision.  Respiratory: Negative.    Cardiovascular: Negative.  Negative for chest pain, palpitations and leg swelling.  Gastrointestinal: Negative.  Negative for abdominal distention, abdominal pain, constipation, diarrhea, nausea, rectal pain and vomiting.  Neurological:  Negative for dizziness and headaches.  Psychiatric/Behavioral: Negative.       Today's Vitals   10/04/23 0829  BP: 110/70  Pulse: 72  Temp: 98.4 F (36.9 C)  TempSrc: Oral  Weight: 262 lb (118.8 kg)  Height: 6' (1.829 m)  PainSc: 0-No pain   Body mass index is 35.53 kg/m.  Wt Readings from Last 3 Encounters:  10/04/23 262 lb (118.8 kg)  06/02/23 262 lb 9.6 oz (119.1 kg)  05/12/23 266 lb (120.7 kg)      Objective:  Physical Exam Vitals reviewed.  Constitutional:      General: He is not in acute distress.    Appearance: Normal appearance. He is obese.  Cardiovascular:     Rate and Rhythm: Normal rate and regular rhythm.     Pulses: Normal pulses.     Heart sounds: Murmur heard.  Pulmonary:     Effort: Pulmonary effort is normal. No respiratory distress.     Breath sounds: Normal breath sounds. No wheezing.  Skin:    General: Skin is warm and dry.     Capillary Refill: Capillary refill takes less than 2 seconds.  Neurological:     General: No focal deficit present.     Mental Status: He is alert and oriented to person, place, and time.     Cranial Nerves: No  cranial nerve deficit.     Motor: No weakness.        Assessment And Plan:  Essential hypertension Assessment & Plan: Blood pressure is well-controlled.  Continue current medications.  Orders: -     CMP14+EGFR  Mixed hyperlipidemia Assessment & Plan: Cholesterol levels remain stable, continue statin, tolerating well.  Orders: -     Lipid panel  Uncontrolled type 2 diabetes mellitus with hyperglycemia (HCC) Assessment & Plan: Will change janumet to jardiance and increasing the dose of Ozempic.  He will have better cardioprotection with Jardiance.  Hemoglobin A1c was increased at last visit.  Orders: -     Semaglutide (1 MG/DOSE); Inject 1 mg into the skin once a week.  Dispense: 9 mL; Refill: 1 -  Empagliflozin; Take 1 tablet (25 mg total) by mouth daily before breakfast.  Dispense: 90 tablet; Refill: 1 -     Hemoglobin A1c  Vitamin D deficiency Assessment & Plan: Will check vitamin D level and supplement as needed.    Also encouraged to spend 15 minutes in the sun daily.    Orders: -     VITAMIN D 25 Hydroxy (Vit-D Deficiency, Fractures)  Class 2 obesity due to excess calories with body mass index (BMI) of 35.0 to 35.9 in adult, unspecified whether serious comorbidity present Assessment & Plan: he is encouraged to strive for BMI less than 30 to decrease cardiac risk. Advised to aim for at least 150 minutes of exercise per week.  He has now retired and I have encouraged him to be sure to stay active to avoid increasing in weight, joint pains and aches.    Other long term (current) drug therapy -     CBC with Differential/Platelet -     TSH    Return for controlled DM check 4 months.  Patient was given opportunity to ask questions. Patient verbalized understanding of the plan and was able to repeat key elements of the plan. All questions were answered to their satisfaction.   Jeanell Sparrow, FNP, have reviewed all documentation for this visit. The documentation  on 10/04/23 for the exam, diagnosis, procedures, and orders are all accurate and complete.    IF YOU HAVE BEEN REFERRED TO A SPECIALIST, IT MAY TAKE 1-2 WEEKS TO SCHEDULE/PROCESS THE REFERRAL. IF YOU HAVE NOT HEARD FROM US/SPECIALIST IN TWO WEEKS, PLEASE GIVE [place] A CALL AT [phone].

## 2023-10-05 LAB — CBC WITH DIFFERENTIAL/PLATELET
Basophils Absolute: 0.1 10*3/uL (ref 0.0–0.2)
Basos: 1 %
EOS (ABSOLUTE): 0.2 10*3/uL (ref 0.0–0.4)
Eos: 3 %
Hematocrit: 41.9 % (ref 37.5–51.0)
Hemoglobin: 14.3 g/dL (ref 13.0–17.7)
Immature Grans (Abs): 0.1 10*3/uL (ref 0.0–0.1)
Immature Granulocytes: 1 %
Lymphocytes Absolute: 2.5 10*3/uL (ref 0.7–3.1)
Lymphs: 28 %
MCH: 29.6 pg (ref 26.6–33.0)
MCHC: 34.1 g/dL (ref 31.5–35.7)
MCV: 87 fL (ref 79–97)
Monocytes Absolute: 0.7 10*3/uL (ref 0.1–0.9)
Monocytes: 8 %
Neutrophils Absolute: 5.3 10*3/uL (ref 1.4–7.0)
Neutrophils: 59 %
Platelets: 221 10*3/uL (ref 150–450)
RBC: 4.83 x10E6/uL (ref 4.14–5.80)
RDW: 13.4 % (ref 11.6–15.4)
WBC: 8.9 10*3/uL (ref 3.4–10.8)

## 2023-10-05 LAB — CMP14+EGFR
ALT: 20 [IU]/L (ref 0–44)
AST: 13 [IU]/L (ref 0–40)
Albumin: 3.8 g/dL — ABNORMAL LOW (ref 3.9–4.9)
Alkaline Phosphatase: 55 [IU]/L (ref 44–121)
BUN/Creatinine Ratio: 14 (ref 10–24)
BUN: 14 mg/dL (ref 8–27)
Bilirubin Total: 0.3 mg/dL (ref 0.0–1.2)
CO2: 19 mmol/L — ABNORMAL LOW (ref 20–29)
Calcium: 9.1 mg/dL (ref 8.6–10.2)
Chloride: 103 mmol/L (ref 96–106)
Creatinine, Ser: 0.99 mg/dL (ref 0.76–1.27)
Globulin, Total: 3 g/dL (ref 1.5–4.5)
Glucose: 204 mg/dL — ABNORMAL HIGH (ref 70–99)
Potassium: 4.1 mmol/L (ref 3.5–5.2)
Sodium: 137 mmol/L (ref 134–144)
Total Protein: 6.8 g/dL (ref 6.0–8.5)
eGFR: 86 mL/min/{1.73_m2} (ref >59)

## 2023-10-05 LAB — HEMOGLOBIN A1C
Est. average glucose Bld gHb Est-mCnc: 206 mg/dL
Hgb A1c MFr Bld: 8.8 % — ABNORMAL HIGH (ref 4.8–5.6)

## 2023-10-05 LAB — LIPID PANEL
Chol/HDL Ratio: 4.5 {ratio} (ref 0.0–5.0)
Cholesterol, Total: 107 mg/dL (ref 100–199)
HDL: 24 mg/dL — ABNORMAL LOW (ref >39)
LDL Chol Calc (NIH): 46 mg/dL (ref 0–99)
Triglycerides: 231 mg/dL — ABNORMAL HIGH (ref 0–149)
VLDL Cholesterol Cal: 37 mg/dL (ref 5–40)

## 2023-10-05 LAB — VITAMIN D 25 HYDROXY (VIT D DEFICIENCY, FRACTURES): Vit D, 25-Hydroxy: 57.7 ng/mL (ref 30.0–100.0)

## 2023-10-05 LAB — TSH: TSH: 3.77 u[IU]/mL (ref 0.450–4.500)

## 2023-10-13 ENCOUNTER — Encounter: Payer: Self-pay | Admitting: Nurse Practitioner

## 2023-10-13 DIAGNOSIS — E66812 Obesity, class 2: Secondary | ICD-10-CM

## 2023-10-13 HISTORY — DX: Obesity, class 2: E66.812

## 2023-10-13 NOTE — Assessment & Plan Note (Signed)
Will change janumet to jardiance and increasing the dose of Ozempic.  He will have better cardioprotection with Jardiance.  Hemoglobin A1c was increased at last visit.

## 2023-10-13 NOTE — Assessment & Plan Note (Signed)
Cholesterol levels remain stable, continue statin, tolerating well.

## 2023-10-13 NOTE — Assessment & Plan Note (Signed)
Blood pressure is well controlled. Continue current medications. 

## 2023-10-13 NOTE — Assessment & Plan Note (Signed)
he is encouraged to strive for BMI less than 30 to decrease cardiac risk. Advised to aim for at least 150 minutes of exercise per week.  He has now retired and I have encouraged him to be sure to stay active to avoid increasing in weight, joint pains and aches.

## 2023-10-13 NOTE — Assessment & Plan Note (Signed)
Will check vitamin D level and supplement as needed.    Also encouraged to spend 15 minutes in the sun daily.   

## 2023-10-27 ENCOUNTER — Encounter: Admitting: Nurse Practitioner

## 2023-10-29 ENCOUNTER — Encounter: Payer: Self-pay | Admitting: Family Medicine

## 2023-10-29 ENCOUNTER — Ambulatory Visit: Admitting: Family Medicine

## 2023-10-29 VITALS — BP 110/70 | HR 69 | Temp 97.9°F | Ht 72.0 in | Wt 254.0 lb

## 2023-10-29 DIAGNOSIS — I10 Essential (primary) hypertension: Secondary | ICD-10-CM | POA: Diagnosis not present

## 2023-10-29 DIAGNOSIS — E66811 Obesity, class 1: Secondary | ICD-10-CM

## 2023-10-29 DIAGNOSIS — E559 Vitamin D deficiency, unspecified: Secondary | ICD-10-CM

## 2023-10-29 DIAGNOSIS — Z0000 Encounter for general adult medical examination without abnormal findings: Secondary | ICD-10-CM

## 2023-10-29 DIAGNOSIS — E6609 Other obesity due to excess calories: Secondary | ICD-10-CM

## 2023-10-29 DIAGNOSIS — E782 Mixed hyperlipidemia: Secondary | ICD-10-CM

## 2023-10-29 DIAGNOSIS — E1165 Type 2 diabetes mellitus with hyperglycemia: Secondary | ICD-10-CM | POA: Diagnosis not present

## 2023-10-29 DIAGNOSIS — Z79899 Other long term (current) drug therapy: Secondary | ICD-10-CM

## 2023-10-29 DIAGNOSIS — Z6834 Body mass index (BMI) 34.0-34.9, adult: Secondary | ICD-10-CM

## 2023-10-29 LAB — POCT URINALYSIS DIPSTICK
Bilirubin, UA: NEGATIVE
Blood, UA: NEGATIVE
Glucose, UA: POSITIVE — AB
Ketones, UA: NEGATIVE
Leukocytes, UA: NEGATIVE
Nitrite, UA: NEGATIVE
Protein, UA: POSITIVE — AB
Spec Grav, UA: >=1.03 — AB (ref 1.010–1.025)
Urobilinogen, UA: 0.2 U/dL
pH, UA: 5.5 (ref 5.0–8.0)

## 2023-10-29 NOTE — Progress Notes (Unsigned)
I,Jameka J Llittleton, CMA,acting as a Neurosurgeon for [employer], NP.,have documented all relevant documentation on the behalf of Stephen Hose, NP,as directed by  Stephen Hose, NP while in the presence of Stephen Hose, NP.  Subjective:   Patient ID: Stephen Sampson , male    DOB: 1961-12-11 , 62 y.o.   MRN: [id number]  Chief Complaint  Patient presents with   Annual Exam    HPI  Patient presents today for a physical. Patient reports compliance with his meds. Patient does not have any questions or concerns at this time. Patient states that he has had CT Chest on 08/05/2023 at a [facility] facility., no abnormalities noted.     Past Medical History:  Diagnosis Date   Diabetes mellitus without complication (HCC)    Hidradenitis    Hypertension      Family History  Problem Relation Age of Onset   Sudden death Mother        Possible MI   Dementia Father      Current Outpatient Medications:    amLODipine (NORVASC) 10 MG tablet, Take 10 mg by mouth daily., Disp: , Rfl:    aspirin EC 81 MG tablet, Take 81 mg by mouth daily., Disp: , Rfl:    atorvastatin (LIPITOR) 40 MG tablet, TAKE 1 TABLET EVERY EVENING, Disp: 90 tablet, Rfl: 3   B-D UF III MINI PEN NEEDLES 31G X 5 MM MISC, USE AS DIRECTED, Disp: 100 each, Rfl: 3   clindamycin (CLEOCIN T) 1 % lotion, Apply  as directed to affected area twice a day, Disp: , Rfl:    doxycycline (VIBRAMYCIN) 50 MG capsule, Take 50 mg by mouth daily., Disp: , Rfl:    empagliflozin (JARDIANCE) 25 MG TABS tablet, Take 1 tablet (25 mg total) by mouth daily before breakfast., Disp: 90 tablet, Rfl: 1   glucose blood (FREESTYLE LITE) test strip, USE AS INSTRUCTED, Disp: 300 strip, Rfl: 3   HUMIRA, 2 PEN, 80 MG/0.8ML PNKT, Inject 1 each as directed every 14 (fourteen) days., Disp: , Rfl:    hydrochlorothiazide (HYDRODIURIL) 25 MG tablet, Take 1 tablet (25 mg total) by mouth daily., Disp: 90 tablet, Rfl: 3   insulin lispro (HUMALOG) 100 UNIT/ML KwikPen, INJECT UNDER  THE SKIN BEFORE MEALS AS PER INSULIN SLIDING SCALE PROTOCOL MAX DOSE 30 UNITS PER INJECTION, Disp: 45 mL, Rfl: 3   LANTUS SOLOSTAR 100 UNIT/ML Solostar Pen, INJECT 50 UNITS UNDER THE SKIN AT BEDTIME, Disp: 135 mL, Rfl: 3   Semaglutide, 1 MG/DOSE, 4 MG/3ML SOPN, Inject 1 mg into the skin once a week., Disp: 9 mL, Rfl: 1   temazepam (RESTORIL) 15 MG capsule, TAKE 1 CAPSULE BY MOUTH EVERY NIGHT AT BEDTIME AS NEEDED FOR SLEEP, Disp: 30 capsule, Rfl: 5   valsartan (DIOVAN) 320 MG tablet, TAKE 1 TABLET DAILY, Disp: 90 tablet, Rfl: 3   Vitamin D, Ergocalciferol, (DRISDOL) 1.25 MG (50000 UNIT) CAPS capsule, TAKE 1 CAPSULE TWICE A WEEK, Disp: 24 capsule, Rfl: 3   Allergies  Allergen Reactions   Metformin And Related Diarrhea     Men's preventive visit. Patient Health Questionnaire (PHQ-2) is  Flowsheet Row Office Visit from 10/04/2023 in [facility]  PHQ-2 Total Score 0     . Patient is on a *** diet. Marital status: Widowed. Relevant history for alcohol use is:  Social History   Substance and Sexual Activity  Alcohol Use Yes   Comment: occ   . Relevant history for tobacco use is:  Social History   Tobacco Use  Smoking Status Every Day   Current packs/day: 1.00   Average packs/day: 1 pack/day for 43.0 years (43.0 ttl pk-yrs)   Types: Cigarettes  Smokeless Tobacco Current  Tobacco Comments   He has not quit smoking at this time; he is not smoking in the car and in the house.    .   Review of Systems  Constitutional: Negative.   HENT: Negative.    Eyes: Negative.   Respiratory: Negative.    Cardiovascular: Negative.   Gastrointestinal: Negative.   Endocrine: Negative.   Genitourinary: Negative.   Musculoskeletal: Negative.   Skin: Negative.   Neurological: Negative.   Hematological: Negative.   Psychiatric/Behavioral: Negative.       Today's Vitals   10/29/23 0927  BP: 110/70  Pulse: 69  Temp: 97.9 F (36.6 C)  Weight: 254 lb (115.2  kg)  Height: 6' (1.829 m)  PainSc: 0-No pain   Body mass index is 34.45 kg/m.  Wt Readings from Last 3 Encounters:  10/29/23 254 lb (115.2 kg)  10/04/23 262 lb (118.8 kg)  06/02/23 262 lb 9.6 oz (119.1 kg)    Objective:  Physical Exam Constitutional:      Appearance: Normal appearance.  Cardiovascular:     Rate and Rhythm: Normal rate and regular rhythm.     Pulses: Normal pulses.     Heart sounds: Normal heart sounds.  Pulmonary:     Effort: Pulmonary effort is normal.     Breath sounds: Normal breath sounds.  Abdominal:     General: Bowel sounds are normal.  Musculoskeletal:        General: Normal range of motion.  Skin:    General: Skin is warm and dry.  Neurological:     General: No focal deficit present.     Mental Status: He is alert and oriented to person, place, and time. Mental status is at baseline.  Psychiatric:        Mood and Affect: Mood normal.         Assessment And Plan:    Encounter for annual physical exam  Essential hypertension -     POCT urinalysis dipstick -     Microalbumin / creatinine urine ratio  Mixed hyperlipidemia  Uncontrolled type 2 diabetes mellitus with hyperglycemia (HCC)  Vitamin D deficiency  Other long term (current) drug therapy  Class 1 obesity due to excess calories without serious comorbidity with body mass index (BMI) of 34.0 to 34.9 in adult     Return for 1 year physical, keep appt with [name]. Patient was given opportunity to ask questions. Patient verbalized understanding of the plan and was able to repeat key elements of the plan. All questions were answered to their satisfaction.   Stephen Hose, NP  I, Stephen Hose, NP, have reviewed all documentation for this visit. The documentation on 10/29/23 for the exam, diagnosis, procedures, and orders are all accurate and complete.

## 2023-10-30 LAB — MICROALBUMIN / CREATININE URINE RATIO
Creatinine, Urine: 100.4 mg/dL
Microalb/Creat Ratio: <3 mg/g{creat} (ref 0–29)
Microalbumin, Urine: <3 ug/mL

## 2023-11-04 DIAGNOSIS — E6609 Other obesity due to excess calories: Secondary | ICD-10-CM | POA: Insufficient documentation

## 2023-11-04 NOTE — Assessment & Plan Note (Signed)
Continue current tx of Diovan 320mg  every day ,hydrochlorothiazide 25 mg every day, Norvasc 10mg  every day.

## 2023-11-04 NOTE — Assessment & Plan Note (Signed)
10/04/2023; A1c 8.8. Continue treatment regimen

## 2023-11-04 NOTE — Assessment & Plan Note (Signed)
Continue Atorvastatin 40mg  every day.

## 2023-11-04 NOTE — Assessment & Plan Note (Signed)
He is encouraged to strive for BMI less than 30 to decrease cardiac risk. Advised to aim for at least 150 minutes of exercise per week.  

## 2023-11-17 ENCOUNTER — Other Ambulatory Visit

## 2023-11-17 ENCOUNTER — Other Ambulatory Visit: Payer: Self-pay | Admitting: Nurse Practitioner

## 2023-11-17 DIAGNOSIS — Z79899 Other long term (current) drug therapy: Secondary | ICD-10-CM

## 2023-11-17 DIAGNOSIS — L732 Hidradenitis suppurativa: Secondary | ICD-10-CM

## 2023-11-21 LAB — QUANTIFERON-TB GOLD PLUS
QuantiFERON Mitogen Value: >10 [IU]/mL
QuantiFERON Nil Value: 0.16 [IU]/mL
QuantiFERON TB1 Ag Value: 0.09 [IU]/mL
QuantiFERON TB2 Ag Value: 0.08 [IU]/mL
QuantiFERON-TB Gold Plus: NEGATIVE

## 2023-12-08 ENCOUNTER — Ambulatory Visit: Admitting: Podiatry

## 2023-12-08 ENCOUNTER — Encounter: Payer: Self-pay | Admitting: Podiatry

## 2023-12-08 VITALS — Ht 72.0 in | Wt 254.0 lb

## 2023-12-08 DIAGNOSIS — B351 Tinea unguium: Secondary | ICD-10-CM

## 2023-12-08 DIAGNOSIS — M79674 Pain in right toe(s): Secondary | ICD-10-CM

## 2023-12-08 DIAGNOSIS — Z794 Long term (current) use of insulin: Secondary | ICD-10-CM | POA: Diagnosis not present

## 2023-12-08 DIAGNOSIS — M79675 Pain in left toe(s): Secondary | ICD-10-CM | POA: Diagnosis not present

## 2023-12-08 DIAGNOSIS — E1165 Type 2 diabetes mellitus with hyperglycemia: Secondary | ICD-10-CM | POA: Diagnosis not present

## 2023-12-16 ENCOUNTER — Encounter: Payer: Self-pay | Admitting: Podiatry

## 2023-12-16 NOTE — Progress Notes (Signed)
  Subjective:  Patient ID: Stephen Sampson, male    DOB: 22-Jun-1961,  MRN: [id number]  62 y.o. male presents preventative diabetic foot care and painful thick toenails that are difficult to trim. Pain interferes with ambulation. Aggravating factors include wearing enclosed shoe gear. Pain is relieved with periodic professional debridement.  Chief Complaint  Patient presents with   Nail Problem    Pt is here for [facility] last A1C was 8.2 PCP is Dr Christell Constant and LOV was last month.    New problem(s): None   PCP is [name], FNP.  Allergies  Allergen Reactions   Metformin And Related Diarrhea    Review of Systems: Negative except as noted in the HPI.   Objective:  Stephen Sampson is a pleasant 62 y.o. male obese in NAD. AAO x 3.  Vascular Examination: Vascular status intact b/l with palpable pedal pulses. CFT immediate b/l. Pedal hair present. No edema. No pain with calf compression b/l. Skin temperature gradient WNL b/l. No varicosities noted. No cyanosis or clubbing noted.  Neurological Examination: Sensation grossly intact b/l with 10 gram monofilament. Vibratory sensation intact b/l.  Dermatological Examination: Pedal skin with normal turgor, texture and tone b/l. No open wounds nor interdigital macerations noted. Toenails 1-5 b/l thick, discolored, elongated with subungual debris and pain on dorsal palpation. No hyperkeratotic lesions noted b/l.   Musculoskeletal Examination: Muscle strength 5/5 to b/l LE.  No pain, crepitus noted b/l. No gross pedal deformities. Patient ambulates independently without assistive aids.   Radiographs: None Last A1c:      Latest Ref Rng & Units 10/04/2023    8:57 AM 06/02/2023    9:07 AM 01/28/2023    9:18 AM  Hemoglobin A1C  Hemoglobin-A1c 4.8 - 5.6 % 8.8  9.0  7.7      Assessment:   1. Pain due to onychomycosis of toenails of both feet   2. Controlled type 2 diabetes mellitus with hyperglycemia, with long-term current use of  insulin (HCC)    Plan:  -Consent given for treatment as described below: -Examined patient. -Patient to continue soft, supportive shoe gear daily. -Mycotic toenails 1-5 bilaterally were debrided in length and girth with sterile nail nippers and dremel without incident. -Patient/POA to call should there be question/concern in the interim.  Return in about 3 months (around 03/07/2024).  Freddie Breech, DPM      [place] LOCATION: [address], [place] [address]                   Office [phone]   [facility] LOCATION: [address] [place], [place] [address] Office [phone]

## 2023-12-19 ENCOUNTER — Encounter: Payer: Self-pay | Admitting: Nurse Practitioner

## 2023-12-20 ENCOUNTER — Other Ambulatory Visit: Payer: Self-pay

## 2023-12-20 MED ORDER — BD PEN NEEDLE MINI U/F 31G X 5 MM MISC: 3 refills | Status: AC

## 2024-01-18 LAB — HM DIABETES EYE EXAM

## 2024-02-08 ENCOUNTER — Encounter: Payer: Self-pay | Admitting: Nurse Practitioner

## 2024-02-08 ENCOUNTER — Other Ambulatory Visit: Payer: Self-pay | Admitting: Nurse Practitioner

## 2024-02-08 ENCOUNTER — Ambulatory Visit: Admitting: Nurse Practitioner

## 2024-02-08 VITALS — BP 100/60 | HR 78 | Temp 98.1°F | Ht 72.0 in | Wt 255.0 lb

## 2024-02-08 DIAGNOSIS — G4700 Insomnia, unspecified: Secondary | ICD-10-CM

## 2024-02-08 DIAGNOSIS — Z2821 Immunization not carried out because of patient refusal: Secondary | ICD-10-CM

## 2024-02-08 DIAGNOSIS — I10 Essential (primary) hypertension: Secondary | ICD-10-CM

## 2024-02-08 DIAGNOSIS — E66811 Obesity, class 1: Secondary | ICD-10-CM

## 2024-02-08 DIAGNOSIS — E782 Mixed hyperlipidemia: Secondary | ICD-10-CM | POA: Diagnosis not present

## 2024-02-08 DIAGNOSIS — Z6834 Body mass index (BMI) 34.0-34.9, adult: Secondary | ICD-10-CM

## 2024-02-08 DIAGNOSIS — E1165 Type 2 diabetes mellitus with hyperglycemia: Secondary | ICD-10-CM | POA: Diagnosis not present

## 2024-02-08 DIAGNOSIS — E6609 Other obesity due to excess calories: Secondary | ICD-10-CM

## 2024-02-08 MED ORDER — OZEMPIC (2 MG/DOSE) 8 MG/3ML ~~LOC~~ SOPN
2.0000 mg | PEN_INJECTOR | SUBCUTANEOUS | 1 refills | Status: DC
Start: 2024-02-08 — End: 2024-07-26

## 2024-02-08 MED ORDER — TEMAZEPAM 15 MG PO CAPS
ORAL_CAPSULE | ORAL | 5 refills | Status: DC
Start: 2024-02-08 — End: 2024-08-09

## 2024-02-08 NOTE — Progress Notes (Signed)
 Madelaine Bhat, CMA,acting as a Neurosurgeon for [name], FNP.,have documented all relevant documentation on the behalf of [name], FNP,as directed by  [name], FNP while in the presence of [name], FNP.  Subjective:  Patient ID: Stephen Sampson , male    DOB: [birth year] , 63 y.o.   MRN: [id number]  Chief Complaint  Patient presents with   Hypertension    HPI  Patient presents today for a bp and dm follow up, Patient reports compliance with medication. Patient denies any chest pain, SOB, or headaches. Patient has no concerns today. He is giving himself 30 units of the fast acting insulin with every meal. He has talked with Genelle in the past.   Hypertension This is a chronic problem. The current episode started more than 1 year ago. The problem has been gradually improving since onset. The problem is uncontrolled. Pertinent negatives include no anxiety, blurred vision, chest pain, headaches or palpitations. There are no associated agents to hypertension. There are no known risk factors for coronary artery disease. Past treatments include angiotensin blockers and alpha 1 blockers. There are no compliance problems.  There is no history of angina. There is no history of chronic renal disease.  Diabetes He presents for his follow-up diabetic visit. He has type 2 diabetes mellitus. His disease course has been improving. Pertinent negatives for hypoglycemia include no dizziness or headaches. Pertinent negatives for diabetes include no blurred vision, no chest pain and no fatigue. There are no hypoglycemic complications. Symptoms are stable. There are no diabetic complications. Risk factors for coronary artery disease include obesity and sedentary lifestyle. Current diabetic treatment includes oral agent (dual therapy). He is compliant with treatment all of the time. His weight is increasing steadily. He is following a generally healthy diet. When asked about meal planning, he reported  none. He has not had a previous visit with a dietitian. He participates in exercise intermittently. (Blood sugar this morning was 165) An ACE inhibitor/angiotensin II receptor blocker is being taken. He does not see a podiatrist.Eye exam is current (12/18/2021 last appt).     Past Medical History:  Diagnosis Date   Diabetes mellitus without complication (HCC)    Hidradenitis    Hypertension      Family History  Problem Relation Age of Onset   Sudden death Mother        Possible MI   Dementia Father      Current Outpatient Medications:    amLODipine (NORVASC) 10 MG tablet, Take 10 mg by mouth daily., Disp: , Rfl:    aspirin EC 81 MG tablet, Take 81 mg by mouth daily., Disp: , Rfl:    atorvastatin (LIPITOR) 40 MG tablet, TAKE 1 TABLET EVERY EVENING, Disp: 90 tablet, Rfl: 3   clindamycin (CLEOCIN T) 1 % lotion, Apply  as directed to affected area twice a day, Disp: , Rfl:    doxycycline (VIBRAMYCIN) 50 MG capsule, Take 50 mg by mouth daily., Disp: , Rfl:    empagliflozin (JARDIANCE) 25 MG TABS tablet, Take 1 tablet (25 mg total) by mouth daily before breakfast., Disp: 90 tablet, Rfl: 1   glucose blood (FREESTYLE LITE) test strip, USE AS INSTRUCTED, Disp: 300 strip, Rfl: 3   HUMIRA, 2 PEN, 80 MG/0.8ML PNKT, Inject 1 each as directed every 14 (fourteen) days., Disp: , Rfl:    hydrochlorothiazide (HYDRODIURIL) 25 MG tablet, Take 1 tablet (25 mg total) by mouth daily., Disp: 90 tablet, Rfl: 3   insulin lispro (HUMALOG) 100  UNIT/ML KwikPen, INJECT UNDER THE SKIN BEFORE MEALS AS PER INSULIN SLIDING SCALE PROTOCOL MAX DOSE 30 UNITS PER INJECTION, Disp: 45 mL, Rfl: 3   Insulin Pen Needle (B-D UF III MINI PEN NEEDLES) 31G X 5 MM MISC, Use as directed, Disp: 100 each, Rfl: 3   LANTUS SOLOSTAR 100 UNIT/ML Solostar Pen, INJECT 50 UNITS UNDER THE SKIN AT BEDTIME, Disp: 135 mL, Rfl: 3   Semaglutide, 2 MG/DOSE, (OZEMPIC, 2 MG/DOSE,) 8 MG/3ML SOPN, Inject 2 mg into the skin once a week., Disp: 9 mL, Rfl:  1   valsartan (DIOVAN) 320 MG tablet, TAKE 1 TABLET DAILY, Disp: 90 tablet, Rfl: 3   Vitamin D, Ergocalciferol, (DRISDOL) 1.25 MG (50000 UNIT) CAPS capsule, TAKE 1 CAPSULE TWICE A WEEK, Disp: 24 capsule, Rfl: 3   temazepam (RESTORIL) 15 MG capsule, TAKE 1 CAPSULE BY MOUTH EVERY NIGHT AT BEDTIME AS NEEDED FOR SLEEP, Disp: 30 capsule, Rfl: 5   Allergies  Allergen Reactions   Metformin And Related Diarrhea     Review of Systems  Constitutional: Negative.  Negative for fatigue.  HENT: Negative.    Eyes: Negative.  Negative for blurred vision.  Respiratory: Negative.    Cardiovascular: Negative.  Negative for chest pain and palpitations.  Gastrointestinal: Negative.   Neurological:  Negative for dizziness and headaches.     Today's Vitals   02/08/24 0826  BP: 100/60  Pulse: 78  Temp: 98.1 F (36.7 C)  TempSrc: Oral  Weight: 255 lb (115.7 kg)  Height: 6' (1.829 m)  PainSc: 0-No pain   Body mass index is 34.58 kg/m.  Wt Readings from Last 3 Encounters:  02/08/24 255 lb (115.7 kg)  12/08/23 254 lb (115.2 kg)  10/29/23 254 lb (115.2 kg)      Objective:  Physical Exam Vitals reviewed.  Constitutional:      General: He is not in acute distress.    Appearance: Normal appearance. He is obese.  Cardiovascular:     Rate and Rhythm: Normal rate and regular rhythm.     Pulses: Normal pulses.     Heart sounds: Murmur heard.  Pulmonary:     Effort: Pulmonary effort is normal. No respiratory distress.     Breath sounds: Normal breath sounds. No wheezing.  Skin:    General: Skin is warm and dry.     Capillary Refill: Capillary refill takes less than 2 seconds.  Neurological:     General: No focal deficit present.     Mental Status: He is alert and oriented to person, place, and time.     Cranial Nerves: No cranial nerve deficit.     Motor: No weakness.         Assessment And Plan:  Essential hypertension Assessment & Plan: Blood pressure is well-controlled.  Continue  current medications.  Orders: -     BMP8+eGFR  Mixed hyperlipidemia Assessment & Plan: Cholesterol levels remain stable, continue statin, tolerating well.  Orders: -     Lipid panel  Uncontrolled type 2 diabetes mellitus with hyperglycemia (HCC) Assessment & Plan: Chronic, had changed his Janumet to [place].  Will see what his A1c is.  Orders: -     Hemoglobin A1c -     Ozempic (2 MG/DOSE); Inject 2 mg into the skin once a week.  Dispense: 9 mL; Refill: 1  Insomnia, unspecified type Assessment & Plan: Continue temazepam  Orders: -     Temazepam; TAKE 1 CAPSULE BY MOUTH EVERY NIGHT AT BEDTIME AS NEEDED FOR SLEEP  Dispense: 30 capsule; Refill: 5  COVID-19 vaccination declined  Class 1 obesity due to excess calories without serious comorbidity with body mass index (BMI) of 34.0 to 34.9 in adult Assessment & Plan: She is encouraged to strive for BMI less than 30 to decrease cardiac risk. Advised to aim for at least 150 minutes of exercise per week.      Return for controlled DM check 4 months.  Patient was given opportunity to ask questions. Patient verbalized understanding of the plan and was able to repeat key elements of the plan. All questions were answered to their satisfaction.    Jeanell Sparrow, FNP, have reviewed all documentation for this visit. The documentation on 02/18/24 for the exam, diagnosis, procedures, and orders are all accurate and complete.   IF YOU HAVE BEEN REFERRED TO A SPECIALIST, IT MAY TAKE 1-2 WEEKS TO SCHEDULE/PROCESS THE REFERRAL. IF YOU HAVE NOT HEARD FROM US/SPECIALIST IN TWO WEEKS, PLEASE GIVE [place] A CALL AT [phone].

## 2024-02-09 ENCOUNTER — Encounter: Payer: Self-pay | Admitting: Nurse Practitioner

## 2024-02-09 LAB — BMP8+EGFR
BUN/Creatinine Ratio: 11 (ref 10–24)
BUN: 14 mg/dL (ref 8–27)
CO2: 20 mmol/L (ref 20–29)
Calcium: 9.5 mg/dL (ref 8.6–10.2)
Chloride: 101 mmol/L (ref 96–106)
Creatinine, Ser: 1.23 mg/dL (ref 0.76–1.27)
Glucose: 193 mg/dL — ABNORMAL HIGH (ref 70–99)
Potassium: 3.9 mmol/L (ref 3.5–5.2)
Sodium: 139 mmol/L (ref 134–144)
eGFR: 66 mL/min/{1.73_m2} (ref >59)

## 2024-02-09 LAB — LIPID PANEL
Chol/HDL Ratio: 4.7 {ratio} (ref 0.0–5.0)
Cholesterol, Total: 113 mg/dL (ref 100–199)
HDL: 24 mg/dL — ABNORMAL LOW (ref >39)
LDL Chol Calc (NIH): 64 mg/dL (ref 0–99)
Triglycerides: 140 mg/dL (ref 0–149)
VLDL Cholesterol Cal: 25 mg/dL (ref 5–40)

## 2024-02-09 LAB — HEMOGLOBIN A1C
Est. average glucose Bld gHb Est-mCnc: 232 mg/dL
Hgb A1c MFr Bld: 9.7 % — ABNORMAL HIGH (ref 4.8–5.6)

## 2024-02-18 DIAGNOSIS — Z2821 Immunization not carried out because of patient refusal: Secondary | ICD-10-CM | POA: Insufficient documentation

## 2024-02-18 DIAGNOSIS — G4700 Insomnia, unspecified: Secondary | ICD-10-CM | POA: Insufficient documentation

## 2024-02-18 NOTE — Assessment & Plan Note (Signed)
Continue temazepam.  

## 2024-02-18 NOTE — Assessment & Plan Note (Signed)
 Cholesterol levels remain stable, continue statin, tolerating well.

## 2024-02-18 NOTE — Assessment & Plan Note (Signed)
 Chronic, had changed his Janumet to [place].  Will see what his A1c is.

## 2024-02-18 NOTE — Assessment & Plan Note (Signed)
 Blood pressure is well controlled. Continue current medications.

## 2024-02-18 NOTE — Assessment & Plan Note (Signed)
 She is encouraged to strive for BMI less than 30 to decrease cardiac risk. Advised to aim for at least 150 minutes of exercise per week.

## 2024-03-02 ENCOUNTER — Other Ambulatory Visit: Payer: Self-pay | Admitting: *Deleted

## 2024-03-02 DIAGNOSIS — I350 Nonrheumatic aortic (valve) stenosis: Secondary | ICD-10-CM

## 2024-03-09 ENCOUNTER — Ambulatory Visit (INDEPENDENT_AMBULATORY_CARE_PROVIDER_SITE_OTHER): Admitting: Podiatry

## 2024-03-09 ENCOUNTER — Other Ambulatory Visit: Payer: Self-pay | Admitting: Nurse Practitioner

## 2024-03-09 ENCOUNTER — Encounter: Payer: Self-pay | Admitting: Podiatry

## 2024-03-09 VITALS — Ht 72.0 in | Wt 255.0 lb

## 2024-03-09 DIAGNOSIS — M79674 Pain in right toe(s): Secondary | ICD-10-CM

## 2024-03-09 DIAGNOSIS — Z794 Long term (current) use of insulin: Secondary | ICD-10-CM

## 2024-03-09 DIAGNOSIS — E119 Type 2 diabetes mellitus without complications: Secondary | ICD-10-CM

## 2024-03-09 DIAGNOSIS — E1165 Type 2 diabetes mellitus with hyperglycemia: Secondary | ICD-10-CM

## 2024-03-09 DIAGNOSIS — B351 Tinea unguium: Secondary | ICD-10-CM | POA: Diagnosis not present

## 2024-03-09 DIAGNOSIS — M79675 Pain in left toe(s): Secondary | ICD-10-CM

## 2024-03-16 NOTE — Progress Notes (Signed)
 ANNUAL DIABETIC FOOT EXAM  Subjective: Stephen Sampson presents today for annual diabetic foot exam.  Chief Complaint  Patient presents with   Nail Problem    Pt is here for [facility] last A1C was 9.7 PCP is Dr Christell Constant and LOV was in February.   Patient confirms h/o diabetes.  Patient denies any h/o foot wounds.  [name], FNP is patient's PCP.  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     aspirin EC 81 MG tablet Take 81 mg by mouth daily.     clindamycin (CLEOCIN T) 1 % lotion Apply  as directed to affected area twice a day     doxycycline (VIBRAMYCIN) 50 MG capsule Take 50 mg by mouth daily.     empagliflozin (JARDIANCE) 25 MG TABS tablet Take 1 tablet (25 mg total) by mouth daily before breakfast. 90 tablet 1   glucose blood (FREESTYLE LITE) test strip USE AS INSTRUCTED 300 strip 3   HUMIRA, 2 PEN,  80 MG/0.8ML PNKT Inject 1 each as directed every 14 (fourteen) days.     hydrochlorothiazide (HYDRODIURIL) 25 MG tablet Take 1 tablet (25 mg total) by mouth daily. 90 tablet 3   Insulin Pen Needle (B-D UF III MINI PEN NEEDLES) 31G X 5 MM MISC Use as directed 100 each 3   Semaglutide, 2 MG/DOSE, (OZEMPIC, 2 MG/DOSE,) 8 MG/3ML SOPN Inject 2 mg into the skin once a week. 9 mL 1   temazepam (RESTORIL) 15 MG capsule TAKE 1 CAPSULE BY MOUTH EVERY NIGHT AT BEDTIME AS NEEDED FOR SLEEP 30 capsule 5   valsartan (DIOVAN) 320 MG tablet TAKE 1 TABLET DAILY 90 tablet 3   Vitamin D, Ergocalciferol, (DRISDOL) 1.25 MG (50000 UNIT) CAPS capsule TAKE 1 CAPSULE TWICE A WEEK 24 capsule 3   No current facility-administered medications on file prior to visit.    Allergies  Allergen Reactions   Metformin And Related Diarrhea   Social History   Occupational History   Not on file  Tobacco Use   Smoking status: Every Day    Current packs/day: 1.00    Average packs/day: 1 pack/day for 43.0 years (43.0 ttl pk-yrs)    Types: Cigarettes   Smokeless tobacco: Current   Tobacco comments:    He has not quit smoking at this time; he is not smoking in the car and in the house.    Substance and Sexual Activity  Alcohol use: Yes    Comment: occ    Drug use: No   Sexual activity: Not on file   Family History  Problem Relation Age of Onset   Sudden death Mother        Possible MI   Dementia Father    Immunization History  Administered Date(s) Administered   Influenza Nasal 09/14/2020   Influenza, Mdck, Trivalent,PF 6+ MOS(egg free) 09/04/2023   Influenza,inj,Quad PF,6+ Mos 09/16/2018, 10/27/2018, 09/15/2019   Influenza-Unspecified 10/15/2015, 10/20/2017, 09/13/2021, 09/06/2022   Moderna Covid-19 Vaccine Bivalent Booster 89yrs & up 09/06/2022   Moderna SARS-COV2 Booster Vaccination 03/22/2021   Moderna Sars-Covid-2 Vaccination 01/23/2020, 02/20/2020, 10/13/2020, 09/26/2021   PNEUMOCOCCAL CONJUGATE-20  01/08/2022   Pfizer(Comirnaty)Fall Seasonal Vaccine 12 years and older 09/04/2023   Pneumococcal Polysaccharide-23 07/31/2019   Tdap 07/13/2016   Zoster Recombinant(Shingrix) 07/10/2020, 09/13/2021     Review of Systems: Negative except as noted in the HPI.   Objective: There were no vitals filed for this visit.  Stephen Sampson is a pleasant 63 y.o. male in NAD. AAO X 3.  Diabetic foot exam was performed with the following findings:   No deformities, ulcerations, or other skin breakdown Normal sensation of 10g monofilament Intact posterior tibialis and dorsalis pedis pulses  Vascular Examination: Capillary refill time immediate b/l. Trace edema b/l. Vascular status intact b/l with palpable pedal pulses. Pedal hair present b/l. No pain with calf compression b/l. Skin temperature gradient WNL b/l. No cyanosis or clubbing b/l. No ischemia or gangrene noted b/l.   Neurological Examination: Sensation grossly intact b/l with 10 gram monofilament. Vibratory sensation intact b/l.   Dermatological Examination: Pedal skin with normal turgor, texture and tone b/l.  No open wounds. No interdigital macerations.   Toenails 1-5 b/l thick, discolored, elongated with subungual debris and pain on dorsal palpation.   No corns, calluses nor porokeratotic lesions noted.  Musculoskeletal Examination: Normal muscle strength 5/5 to all lower extremity muscle groups bilaterally. No pain, crepitus or joint limitation noted with ROM b/l LE. No gross bony pedal deformities b/l. Patient ambulates independently without assistive aids.  Radiographs: None     Lab Results  Component Value Date   HGBA1C 9.7 (H) 02/08/2024   ADA Risk Categorization: Low Risk :  Patient has all of the following: Intact protective sensation No prior foot ulcer  No severe deformity Pedal pulses present  Assessment: 1. Pain due to onychomycosis of toenails of both feet   2. Controlled type 2 diabetes mellitus with  hyperglycemia, with long-term current use of insulin (HCC)   3. Encounter for diabetic foot exam (HCC)     Plan: Diabetic foot examination performed today. All patient's and/or POA's questions/concerns addressed on today's visit. Toenails 1-5 debrided in length and girth without incident. Continue foot and shoe inspections daily. Monitor blood glucose per PCP/Endocrinologist's recommendations. Continue soft, supportive shoe gear daily. Report any pedal injuries to medical professional. Call office if there are any questions/concerns. -Patient/POA to call should there be question/concern in the interim. Return in about 3 months (around 06/09/2024).  Freddie Breech, DPM      [place] LOCATION: [address] [place], [place] [address]  Office [phone]   [facility] LOCATION: [address] [place], [place] [address] Office [phone]

## 2024-03-22 ENCOUNTER — Other Ambulatory Visit: Payer: Self-pay | Admitting: Nurse Practitioner

## 2024-03-22 DIAGNOSIS — E1165 Type 2 diabetes mellitus with hyperglycemia: Secondary | ICD-10-CM

## 2024-03-25 ENCOUNTER — Other Ambulatory Visit: Payer: Self-pay | Admitting: Cardiology

## 2024-03-25 DIAGNOSIS — I119 Hypertensive heart disease without heart failure: Secondary | ICD-10-CM

## 2024-03-29 ENCOUNTER — Other Ambulatory Visit: Payer: Self-pay | Admitting: Nurse Practitioner

## 2024-03-30 ENCOUNTER — Other Ambulatory Visit (HOSPITAL_BASED_OUTPATIENT_CLINIC_OR_DEPARTMENT_OTHER): Payer: Self-pay

## 2024-03-30 MED ORDER — AMLODIPINE BESYLATE 10 MG PO TABS
10.0000 mg | ORAL_TABLET | Freq: Every day | ORAL | 1 refills | Status: DC
  Filled 2024-03-30: qty 90, 90d supply, fill #0

## 2024-03-31 ENCOUNTER — Other Ambulatory Visit (HOSPITAL_COMMUNITY): Payer: Self-pay

## 2024-04-19 ENCOUNTER — Encounter: Payer: Self-pay | Admitting: Nurse Practitioner

## 2024-04-19 ENCOUNTER — Other Ambulatory Visit: Payer: Self-pay

## 2024-04-19 MED ORDER — AMLODIPINE BESYLATE 10 MG PO TABS: 10.0000 mg | ORAL_TABLET | Freq: Every day | ORAL | 1 refills | Status: DC

## 2024-04-19 MED ORDER — FREESTYLE LITE TEST VI STRP: ORAL_STRIP | 3 refills | Status: AC

## 2024-04-20 ENCOUNTER — Ambulatory Visit (HOSPITAL_COMMUNITY): Attending: Cardiology

## 2024-04-20 DIAGNOSIS — I350 Nonrheumatic aortic (valve) stenosis: Secondary | ICD-10-CM | POA: Insufficient documentation

## 2024-04-20 LAB — ECHOCARDIOGRAM COMPLETE
AR max vel: 1.23 cm2
AV Area VTI: 1.32 cm2
AV Area mean vel: 1.25 cm2
AV Mean grad: 25.3 mmHg
AV Peak grad: 50.2 mmHg
Ao pk vel: 3.54 m/s
Area-P 1/2: 2.5 cm2
Est EF: >75
S' Lateral: 3 cm

## 2024-05-10 DIAGNOSIS — I517 Cardiomegaly: Secondary | ICD-10-CM | POA: Insufficient documentation

## 2024-05-10 NOTE — Progress Notes (Signed)
  Cardiology Office Note:   Date:  05/12/2024  ID:  Stephen Sampson, DOB 1961/02/24, MRN [id number] PCP: Susanna Epley, FNP  [facility] Providers Cardiologist:  Eilleen Grates, MD {  History of Present Illness:   Stephen Sampson is a 63 y.o. male  who presents for evaluation of aortic stenosis. Echo in March 2020 demonstrated moderate concentric left ventricular hypertrophy.  There was mild aortic valve stenosis. Cardiac cath from 2005 demonstrated an EF 60%.  There was no evidence of coronary artery disease.      Since I last saw him he had an equivocal PYP and no evidence of amyloid on MRI.  Follow up echo in May 2025 demonstrated continued moderate with a mean gradient of 25.3.   He feels well.  He did a lot of walking at a ham [employer] convention the other day.  The patient denies any new symptoms such as chest discomfort, neck or arm discomfort. There has been no new shortness of breath, PND or orthopnea. There have been no reported palpitations, presyncope or syncope.   ROS: As stated in the HPI and negative for all other systems.  Studies Reviewed:    EKG:   EKG Interpretation Date/Time:  Thursday May 11 2024 09:44:37 EDT Ventricular Rate:  67 PR Interval:  176 QRS Duration:  90 QT Interval:  402 QTC Calculation: 424 R Axis:   58  Text Interpretation: Normal sinus rhythm T wave abnormality, consider inferior ischemia T wave abnormality, consider anterolateral ischemia When compared with ECG of 24-Jun-2004 10:28, T wave inversion more evident in Lateral leads Confirmed by Eilleen Grates ([id number]) on 05/11/2024 10:09:07 AM    Risk Assessment/Calculations:         Physical Exam:   VS:  BP 126/72   Pulse 67   Ht 6' (1.829 m)   Wt 255 lb (115.7 kg)   SpO2 97%   BMI 34.58 kg/m    Wt Readings from Last 3 Encounters:  05/11/24 255 lb (115.7 kg)  03/09/24 255 lb (115.7 kg)  02/08/24 255 lb (115.7 kg)     GEN: Well nourished, well developed in no acute  distress NECK: No JVD; No carotid bruits CARDIAC: RRR, 3 out of 6 apical systolic murmur radiating slightly at the aortic outflow tract, no diastolic murmurs, rubs, gallops RESPIRATORY:  Clear to auscultation without rales, wheezing or rhonchi  ABDOMEN: Soft, non-tender, non-distended EXTREMITIES:  No edema; No deformity   ASSESSMENT AND PLAN:   Aortic stenosis:   There was moderate AS on echo in May.   I will follow this up with an echo next year.  No change in therapy.   LVH: He had an equivocal PYP scan but no evidence of amyloid on MRI and no evidence of left ventricular hypertrophy.  This is probably related to his blood pressure and aortic stenosis.  No change in therapy.  I will follow this with echo next year.   HTN: His blood pressure is at target.  No change in therapy.   DM: A1c was 9.8 earlier this year.  He is having this followed by his primary provider.     Tobacco abuse:   He understands need to quit smoking and we talked about this again today.  He had previously tried Chantix .  Follow up with me in 1 year after the echo.  Signed, Eilleen Grates, MD

## 2024-05-11 ENCOUNTER — Encounter: Payer: Self-pay | Admitting: Cardiology

## 2024-05-11 ENCOUNTER — Ambulatory Visit: Attending: Cardiology | Admitting: Cardiology

## 2024-05-11 VITALS — BP 126/72 | HR 67 | Ht 72.0 in | Wt 255.0 lb

## 2024-05-11 DIAGNOSIS — I517 Cardiomegaly: Secondary | ICD-10-CM

## 2024-05-11 DIAGNOSIS — I10 Essential (primary) hypertension: Secondary | ICD-10-CM | POA: Diagnosis not present

## 2024-05-11 DIAGNOSIS — I350 Nonrheumatic aortic (valve) stenosis: Secondary | ICD-10-CM

## 2024-05-11 DIAGNOSIS — E118 Type 2 diabetes mellitus with unspecified complications: Secondary | ICD-10-CM

## 2024-05-11 NOTE — Patient Instructions (Signed)
 Medication Instructions:  Your physician recommends that you continue on your current medications as directed. Please refer to the Current Medication list given to you today.  *If you need a refill on your cardiac medications before your next appointment, please call your pharmacy*  Testing/Procedures: Your physician has requested that you have an echocardiogram in May 2026. Echocardiography is a painless test that uses sound waves to create images of your heart. It provides your doctor with information about the size and shape of your heart and how well your heart's chambers and valves are working. This procedure takes approximately one hour. There are no restrictions for this procedure. Please do NOT wear cologne, perfume, aftershave, or lotions (deodorant is allowed). Please arrive 15 minutes prior to your appointment time.  Please note: We ask at that you not bring children with you during ultrasound (echo/ vascular) testing. Due to room size and safety concerns, children are not allowed in the ultrasound rooms during exams. Our front office staff cannot provide observation of children in our lobby area while testing is being conducted. An adult accompanying a patient to their appointment will only be allowed in the ultrasound room at the discretion of the ultrasound technician under special circumstances. We apologize for any inconvenience.  Follow-Up: At [facility], you and your health needs are our priority.  As part of our continuing mission to provide you with exceptional heart care, our providers are all part of one team.  This team includes your primary Cardiologist (physician) and Advanced Practice Providers or APPs (Physician Assistants and Nurse Practitioners) who all work together to provide you with the care you need, when you need it.  Your next appointment:   1 year(s)  Provider:   Eilleen Grates, MD  We recommend signing up for the patient portal called "MyChart".   Sign up information is provided on this After Visit Summary.  MyChart is used to connect with patients for Virtual Visits (Telemedicine).  Patients are able to view lab/test results, encounter notes, upcoming appointments, etc.  Non-urgent messages can be sent to your provider as well.   To learn more about what you can do with MyChart, go to [email or web address].

## 2024-05-12 ENCOUNTER — Encounter: Payer: Self-pay | Admitting: Cardiology

## 2024-05-24 ENCOUNTER — Other Ambulatory Visit: Payer: Self-pay | Admitting: Cardiology

## 2024-05-24 DIAGNOSIS — I10 Essential (primary) hypertension: Secondary | ICD-10-CM

## 2024-06-19 NOTE — Progress Notes (Unsigned)
 LILLETTE Kristeen JINNY Gladis, CMA,acting as a Neurosurgeon for [name], FNP.,have documented all relevant documentation on the behalf of [name], FNP,as directed by  [name], FNP while in the presence of [name], FNP.  Subjective:  Patient ID: Stephen Sampson , male    DOB: 12/02/1961 , 63 y.o.   MRN: [id number]  No chief complaint on file.   HPI  HPI   Past Medical History:  Diagnosis Date   Diabetes mellitus without complication (HCC)    Hidradenitis    Hypertension      Family History  Problem Relation Age of Onset   Sudden death Mother        Possible MI   Dementia Father      Current Outpatient Medications:    amLODipine  (NORVASC ) 10 MG tablet, Take 1 tablet (10 mg total) by mouth daily., Disp: 90 tablet, Rfl: 1   aspirin EC 81 MG tablet, Take 81 mg by mouth daily., Disp: , Rfl:    atorvastatin (LIPITOR) 40 MG tablet, TAKE 1 TABLET EVERY EVENING, Disp: 90 tablet, Rfl: 3   clindamycin (CLEOCIN T) 1 % lotion, Apply  as directed to affected area twice a day, Disp: , Rfl:    doxycycline (VIBRAMYCIN) 50 MG capsule, Take 50 mg by mouth daily., Disp: , Rfl:    glucose blood (FREESTYLE LITE) test strip, USE AS INSTRUCTED, Disp: 300 strip, Rfl: 3   HUMIRA, 2 PEN, 80 MG/0.8ML PNKT, Inject 1 each as directed every 14 (fourteen) days., Disp: , Rfl:    hydrochlorothiazide  (HYDRODIURIL ) 25 MG tablet, TAKE 1 TABLET DAILY, Disp: 90 tablet, Rfl: 3   insulin  lispro (HUMALOG ) 100 UNIT/ML KwikPen, INJECT UNDER THE SKIN BEFORE MEALS AS PER INSULIN  SLIDING SCALE PROTOCOL, Disp: 45 mL, Rfl: 3   Insulin  Pen Needle (B-D UF III MINI PEN NEEDLES) 31G X 5 MM MISC, Use as directed, Disp: 100 each, Rfl: 3   JARDIANCE  25 MG TABS tablet, TAKE 1 TABLET DAILY BEFORE BREAKFAST, Disp: 90 tablet, Rfl: 3   LANTUS  SOLOSTAR 100 UNIT/ML Solostar Pen, INJECT 50 UNITS UNDER THE SKIN AT BEDTIME, Disp: 45 mL, Rfl: 3   Semaglutide , 2 MG/DOSE, (OZEMPIC , 2 MG/DOSE,) 8 MG/3ML SOPN, Inject 2 mg into the skin once a  week., Disp: 9 mL, Rfl: 1   temazepam  (RESTORIL ) 15 MG capsule, TAKE 1 CAPSULE BY MOUTH EVERY NIGHT AT BEDTIME AS NEEDED FOR SLEEP, Disp: 30 capsule, Rfl: 5   valsartan  (DIOVAN ) 320 MG tablet, TAKE 1 TABLET DAILY, Disp: 90 tablet, Rfl: 3   Vitamin D , Ergocalciferol , (DRISDOL ) 1.25 MG (50000 UNIT) CAPS capsule, TAKE 1 CAPSULE TWICE A WEEK, Disp: 24 capsule, Rfl: 3   Allergies  Allergen Reactions   Metformin And Related Diarrhea     Review of Systems   There were no vitals filed for this visit. There is no height or weight on file to calculate BMI.  Wt Readings from Last 3 Encounters:  05/11/24 255 lb (115.7 kg)  03/09/24 255 lb (115.7 kg)  02/08/24 255 lb (115.7 kg)    The ASCVD Risk score (Arnett DK, et al., 2019) failed to calculate for the following reasons:   The valid total cholesterol range is 130 to 320 mg/dL  Objective:  Physical Exam      Assessment And Plan:  Essential hypertension  Mixed hyperlipidemia  Uncontrolled type 2 diabetes mellitus with hyperglycemia (HCC)    No follow-ups on file.  Patient was given opportunity to ask questions. Patient verbalized understanding of the  plan and was able to repeat key elements of the plan. All questions were answered to their satisfaction.    LILLETTE [name], FNP, have reviewed all documentation for this visit. The documentation on 06/19/24 for the exam, diagnosis, procedures, and orders are all accurate and complete.   IF YOU HAVE BEEN REFERRED TO A SPECIALIST, IT MAY TAKE 1-2 WEEKS TO SCHEDULE/PROCESS THE REFERRAL. IF YOU HAVE NOT HEARD FROM US /SPECIALIST IN TWO WEEKS, PLEASE GIVE US  A CALL AT [phone].

## 2024-06-20 ENCOUNTER — Ambulatory Visit: Admitting: Nurse Practitioner

## 2024-06-20 ENCOUNTER — Encounter: Payer: Self-pay | Admitting: Nurse Practitioner

## 2024-06-20 VITALS — BP 100/60 | HR 70 | Temp 98.4°F | Ht 72.0 in | Wt 254.0 lb

## 2024-06-20 DIAGNOSIS — E1165 Type 2 diabetes mellitus with hyperglycemia: Secondary | ICD-10-CM | POA: Diagnosis not present

## 2024-06-20 DIAGNOSIS — E66811 Obesity, class 1: Secondary | ICD-10-CM

## 2024-06-20 DIAGNOSIS — E782 Mixed hyperlipidemia: Secondary | ICD-10-CM

## 2024-06-20 DIAGNOSIS — Z6834 Body mass index (BMI) 34.0-34.9, adult: Secondary | ICD-10-CM

## 2024-06-20 DIAGNOSIS — I10 Essential (primary) hypertension: Secondary | ICD-10-CM | POA: Diagnosis not present

## 2024-06-20 DIAGNOSIS — E6609 Other obesity due to excess calories: Secondary | ICD-10-CM

## 2024-06-20 DIAGNOSIS — Z2821 Immunization not carried out because of patient refusal: Secondary | ICD-10-CM

## 2024-06-20 NOTE — Assessment & Plan Note (Signed)
 He is encouraged to strive for BMI less than 30 to decrease cardiac risk. Advised to aim for at least 150 minutes of exercise per week.

## 2024-06-20 NOTE — Assessment & Plan Note (Signed)
 Chronic, A1c was increased to 9.2 at last visit, pending lab results will consider switching to Mounjaro. He will find out what GLP1 is on his fomulary and make me aware.

## 2024-06-20 NOTE — Patient Instructions (Signed)
 [email or web address]

## 2024-06-20 NOTE — Assessment & Plan Note (Signed)

## 2024-06-20 NOTE — Assessment & Plan Note (Signed)
 Cholesterol levels remain stable, continue statin, tolerating well.

## 2024-06-20 NOTE — Assessment & Plan Note (Signed)
 Blood pressure is well controlled. Continue current medications.

## 2024-06-21 LAB — BMP8+EGFR
BUN/Creatinine Ratio: 15 (ref 10–24)
BUN: 16 mg/dL (ref 8–27)
CO2: 17 mmol/L — ABNORMAL LOW (ref 20–29)
Calcium: 9.4 mg/dL (ref 8.6–10.2)
Chloride: 104 mmol/L (ref 96–106)
Creatinine, Ser: 1.04 mg/dL (ref 0.76–1.27)
Glucose: 169 mg/dL — ABNORMAL HIGH (ref 70–99)
Potassium: 3.9 mmol/L (ref 3.5–5.2)
Sodium: 138 mmol/L (ref 134–144)
eGFR: 81 mL/min/{1.73_m2} (ref >59)

## 2024-06-21 LAB — HEMOGLOBIN A1C
Est. average glucose Bld gHb Est-mCnc: 209 mg/dL
Hgb A1c MFr Bld: 8.9 % — ABNORMAL HIGH (ref 4.8–5.6)

## 2024-06-21 LAB — LIPID PANEL
Chol/HDL Ratio: 5 ratio (ref 0.0–5.0)
Cholesterol, Total: 115 mg/dL (ref 100–199)
HDL: 23 mg/dL — ABNORMAL LOW (ref >39)
LDL Chol Calc (NIH): 68 mg/dL (ref 0–99)
Triglycerides: 135 mg/dL (ref 0–149)
VLDL Cholesterol Cal: 24 mg/dL (ref 5–40)

## 2024-06-25 ENCOUNTER — Ambulatory Visit: Payer: Self-pay | Admitting: Nurse Practitioner

## 2024-06-27 ENCOUNTER — Encounter: Payer: Self-pay | Admitting: Podiatry

## 2024-06-27 ENCOUNTER — Ambulatory Visit (INDEPENDENT_AMBULATORY_CARE_PROVIDER_SITE_OTHER): Admitting: Podiatry

## 2024-06-27 DIAGNOSIS — B351 Tinea unguium: Secondary | ICD-10-CM | POA: Diagnosis not present

## 2024-06-27 DIAGNOSIS — Z794 Long term (current) use of insulin: Secondary | ICD-10-CM

## 2024-06-27 DIAGNOSIS — M79674 Pain in right toe(s): Secondary | ICD-10-CM | POA: Diagnosis not present

## 2024-06-27 DIAGNOSIS — M79675 Pain in left toe(s): Secondary | ICD-10-CM

## 2024-06-27 DIAGNOSIS — E1165 Type 2 diabetes mellitus with hyperglycemia: Secondary | ICD-10-CM | POA: Diagnosis not present

## 2024-07-02 ENCOUNTER — Encounter (HOSPITAL_COMMUNITY): Payer: Self-pay | Admitting: Emergency Medicine

## 2024-07-02 ENCOUNTER — Encounter: Payer: Self-pay | Admitting: Podiatry

## 2024-07-02 ENCOUNTER — Other Ambulatory Visit: Payer: Self-pay

## 2024-07-02 ENCOUNTER — Emergency Department (HOSPITAL_COMMUNITY)
Admission: EM | Admit: 2024-07-02 | Discharge: 2024-07-03 | Disposition: A | Discharge status: Home / Self Care | Attending: Emergency Medicine | Admitting: Emergency Medicine

## 2024-07-02 DIAGNOSIS — L732 Hidradenitis suppurativa: Secondary | ICD-10-CM | POA: Diagnosis not present

## 2024-07-02 DIAGNOSIS — M65051 Abscess of tendon sheath, right thigh: Secondary | ICD-10-CM | POA: Diagnosis present

## 2024-07-02 DIAGNOSIS — F17210 Nicotine dependence, cigarettes, uncomplicated: Secondary | ICD-10-CM | POA: Insufficient documentation

## 2024-07-02 DIAGNOSIS — L0291 Cutaneous abscess, unspecified: Secondary | ICD-10-CM

## 2024-07-02 DIAGNOSIS — E119 Type 2 diabetes mellitus without complications: Secondary | ICD-10-CM | POA: Diagnosis not present

## 2024-07-02 DIAGNOSIS — I10 Essential (primary) hypertension: Secondary | ICD-10-CM | POA: Insufficient documentation

## 2024-07-02 MED ORDER — LIDOCAINE HCL (PF) 1 % IJ SOLN
20.0000 mL | Freq: Once | INTRAMUSCULAR | Status: AC
  Administered 2024-07-03: 20 mL
  Filled 2024-07-02: qty 30

## 2024-07-02 NOTE — ED Provider Notes (Signed)
 [facility] Emergency Department Provider Note MRN:  [id number]  Arrival date & time: 07/03/24     Chief Complaint   Abscess   History of Present Illness   Stephen Sampson is a 63 y.o. year-old male with a history of diabetes, hidradenitis presenting to the ED with chief complaint of abscess.  Abscess forming in the right inner thigh.  Experiences these often due to his hidradenitis.  Usually he can manage them at home with warm compresses and they begin to drain.  This 1 is getting more more painful and not draining.  Here for drainage.  Denies fever.  Review of Systems  A thorough review of systems was obtained and all systems are negative except as noted in the HPI and PMH.   Patient's Health History    Past Medical History:  Diagnosis Date   Cataract 2007   Diabetes mellitus without complication (HCC)    Heart murmur 2017   Hidradenitis    Hypertension    Sleep apnea 1998    Past Surgical History:  Procedure Laterality Date   CARDIAC CATHETERIZATION     EYE SURGERY  2020   Laser to fix retina    Family History  Problem Relation Age of Onset   Sudden death Mother        Possible MI   Cancer Mother    Diabetes Mother    Heart disease Mother    Obesity Mother    Dementia Father    Diabetes Maternal Grandfather     Social History   Socioeconomic History   Marital status: Widowed    Spouse name: Not on file   Number of children: Not on file   Years of education: Not on file   Highest education level: 12th grade  Occupational History   Not on file  Tobacco Use   Smoking status: Every Day    Current packs/day: 1.00    Average packs/day: 1 pack/day for 43.0 years (43.0 ttl pk-yrs)    Types: Cigarettes   Smokeless tobacco: Current   Tobacco comments:    He has not quit smoking at this time; he is not smoking in the car and in the house.    Substance and Sexual Activity   Alcohol use: Yes    Comment: occ    Drug use: No    Sexual activity: Not on file  Other Topics Concern   Not on file  Social History Narrative   Not on file   Social Drivers of Health   Financial Resource Strain: Low Risk  (06/16/2024)   Overall Financial Resource Strain (CARDIA)    Difficulty of Paying Living Expenses: Not hard at all  Food Insecurity: No Food Insecurity (06/16/2024)   Hunger Vital Sign    Worried About Running Out of Food in the Last Year: Never true    Ran Out of Food in the Last Year: Never true  Transportation Needs: No Transportation Needs (06/16/2024)   PRAPARE - Administrator, Civil Service (Medical): No    Lack of Transportation (Non-Medical): No  Physical Activity: Insufficiently Active (06/16/2024)   Exercise Vital Sign    Days of Exercise per Week: 1 day    Minutes of Exercise per Session: 20 min  Stress: No Stress Concern Present (06/16/2024)   [employer] of Occupational Health - Occupational Stress Questionnaire    Feeling of Stress: Only a little  Social Connections: Moderately Isolated (06/16/2024)   Social Connection and Isolation Panel  Frequency of Communication with Friends and Family: Twice a week    Frequency of Social Gatherings with Friends and Family: Once a week    Attends Religious Services: Never    Database administrator or Organizations: Yes    Attends Banker Meetings: 1 to 4 times per year    Marital Status: Widowed  Intimate Partner Violence: Unknown (03/27/2022)   Received from [facility]   HITS    Physically Hurt: Not on file    Insult or Talk Down To: Not on file    Threaten Physical Harm: Not on file    Scream or Curse: Not on file     Physical Exam   Vitals:   07/02/24 2249  BP: (!) 165/78  Pulse: (!) 101  Resp: 17  Temp: 98.8 F (37.1 C)  SpO2: 92%    CONSTITUTIONAL: Well-appearing, NAD NEURO/PSYCH:  Alert and oriented x 3, no focal deficits EYES:  eyes equal and reactive ENT/NECK:  no LAD, no JVD CARDIO: Regular rate,  well-perfused, normal S1 and S2 PULM:  CTAB no wheezing or rhonchi GI/GU:  non-distended, non-tender MSK/SPINE:  No gross deformities, no edema SKIN:  no rash, atraumatic   *Additional and/or pertinent findings included in MDM below  Diagnostic and Interventional Summary    EKG Interpretation Date/Time:    Ventricular Rate:    PR Interval:    QRS Duration:    QT Interval:    QTC Calculation:   R Axis:      Text Interpretation:         Labs Reviewed - No data to display  No orders to display    Medications  lidocaine  (PF) (XYLOCAINE ) 1 % injection 20 mL (has no administration in time range)  sulfamethoxazole -trimethoprim  (BACTRIM  DS) 800-160 MG per tablet 1 tablet (has no administration in time range)     Procedures  /  Critical Care .Incision and Drainage  Date/Time: 07/03/2024 1:38 AM  Performed by: Theadore Ozell HERO, MD Authorized by: Theadore Ozell HERO, MD   Consent:    Consent obtained:  Verbal   Consent given by:  Patient   Risks, benefits, and alternatives were discussed: yes     Risks discussed:  Bleeding, damage to other organs, incomplete drainage, pain and infection   Alternatives discussed:  No treatment Universal protocol:    Procedure explained and questions answered to patient or proxy's satisfaction: yes     Patient identity confirmed:  Verbally with patient Location:    Type:  Abscess   Size:  5cm   Location:  Lower extremity   Lower extremity location: Right inner thigh. Pre-procedure details:    Skin preparation:  Povidone-iodine Sedation:    Sedation type:  None Anesthesia:    Anesthesia method:  Local infiltration   Local anesthetic:  Lidocaine  1% w/o epi Procedure type:    Complexity:  Complex Procedure details:    Incision types:  Single straight   Incision depth:  Subcutaneous   Wound management:  Probed and deloculated, irrigated with saline, extensive cleaning and debrided   Drainage:  Purulent   Drainage amount:  Copious   Wound  treatment:  Wound left open   Packing materials:  None Post-procedure details:    Procedure completion:  Tolerated well, no immediate complications   ED Course and Medical Decision Making  Initial Impression and Ddx Large tennis ball sized abscess within the right inner thigh with fluctuance.  Will perform incision and drainage.  No systemic symptoms.  Past medical/surgical history that increases complexity of ED encounter: Hidradenitis, diabetes  Interpretation of Diagnostics Laboratory and/or imaging options to aid in the diagnosis/care of the patient were considered.  After careful history and physical examination, it was determined that there was no indication for diagnostics at this time.  Patient Reassessment and Ultimate Disposition/Management     See procedural details above, appropriate for discharge.  Patient management required discussion with the following services or consulting groups:  None  Complexity of Problems Addressed Acute complicated illness or Injury  Additional Data Reviewed and Analyzed Further history obtained from: Prior labs/imaging results  Additional Factors Impacting ED Encounter Risk Prescriptions and Minor Procedures  Ozell HERO. Theadore, MD [facility] Emergency Medicine [facility] [email or web address]  Final Clinical Impressions(s) / ED Diagnoses     ICD-10-CM   1. Abscess  L02.91     2. Hidradenitis  L73.2       ED Discharge Orders          Ordered    sulfamethoxazole -trimethoprim  (BACTRIM  DS) 800-160 MG tablet  2 times daily        07/03/24 0135             Discharge Instructions Discussed with and Provided to Patient:     Discharge Instructions      You were evaluated in the Emergency Department and after careful evaluation, we did not find any emergent condition requiring admission or further testing in the hospital.  Your exam/testing today was overall reassuring.  We drained your abscess here in the  emergency department.  Recommend follow-up with your regular doctors, dermatologist.  Continue your doxycycline.  Also prescribing Bactrim .  Tylenol  or Motrin for pain.  Please return to the Emergency Department if you experience any worsening of your condition.  Thank you for allowing us  to be a part of your care.        Theadore Ozell HERO, MD 07/03/24 [phone]

## 2024-07-02 NOTE — ED Triage Notes (Signed)
 Pt in with non-draining baseball sized abscess to R inner thigh, near groin. Pt has hx of Hydradenitis Suppurativa, states he takes a maintenance dose of Doxycycline daily to reduce chance of flares. States his temp is a degree above normal

## 2024-07-02 NOTE — Progress Notes (Signed)
  Subjective:  Patient ID: Stephen Sampson, male    DOB: [birth year],  MRN: [id number]  63 y.o. male presents to clinic with  preventative diabetic foot care and painful thick toenails that are difficult to trim. Pain interferes with ambulation. Aggravating factors include wearing enclosed shoe gear. Pain is relieved with periodic professional debridement.  Chief Complaint  Patient presents with   Diabetes    DFC IDDM A1C 8.9. Toenail trim.     New problem(s): None   PCP is Georgina Speaks, FNP. AR[place] 06/20/2024.  Allergies  Allergen Reactions   Metformin And Related Diarrhea    Review of Systems: Negative except as noted in the HPI.   Objective:  Stephen Sampson is a pleasant 63 y.o. male in NAD. AAO x 3.  Vascular Examination: Vascular status intact b/l with palpable pedal pulses. CFT immediate b/l. No edema. No pain with calf compression b/l. Skin temperature gradient WNL b/l. No ischemia or gangrene noted b/l LE. No cyanosis or clubbing noted b/l LE.  Neurological Examination: Sensation grossly intact b/l with 10 gram monofilament. Vibratory sensation intact b/l.   Dermatological Examination: Pedal skin with normal turgor, texture and tone b/l. Toenails 1-5 b/l thick, discolored, elongated with subungual debris and pain on dorsal palpation. No hyperkeratotic lesions noted b/l.   Musculoskeletal Examination: Muscle strength 5/5 to b/l LE. Muscle strength 5/5 to all lower extremity muscle groups bilaterally. No pain, crepitus or joint limitation noted with ROM bilateral LE. No gross bony deformities bilaterally.  Radiographs: None  Last A1c:      Latest Ref Rng & Units 06/20/2024    9:02 AM 02/08/2024    8:59 AM 10/04/2023    8:57 AM  Hemoglobin A1C  Hemoglobin-A1c 4.8 - 5.6 % 8.9  9.7  8.8      Assessment:   1. Pain due to onychomycosis of toenails of both feet   2. Controlled type 2 diabetes mellitus with hyperglycemia, with long-term current use of insulin   [facility])    Plan:  Consent given for treatment. Patient examined. All patient's and/or POA's questions/concerns addressed on today's visit. Mycotic toenails 1-5 debrided in length and girth without incident by assistant Andrez Manchester. Continue foot and shoe inspections daily. Monitor blood glucose per PCP/Endocrinologist's recommendations.Continue soft, supportive shoe gear daily. Report any pedal injuries to medical professional. Call office if there are any quesitons/concerns. -Patient/POA to call should there be question/concern in the interim.  Return in about 3 months (around 09/27/2024).  Delon LITTIE Merlin, DPM      [place] LOCATION: [address] [place] [address]                   Office [phone]   [facility] LOCATION: [address] [place], [place] [address] Office [phone]

## 2024-07-03 MED ORDER — SULFAMETHOXAZOLE-TRIMETHOPRIM 800-160 MG PO TABS
1.0000 | ORAL_TABLET | Freq: Two times a day (BID) | ORAL | 0 refills | Status: AC
Start: 2024-07-03 — End: 2024-07-10

## 2024-07-03 MED ORDER — SULFAMETHOXAZOLE-TRIMETHOPRIM 800-160 MG PO TABS
1.0000 | ORAL_TABLET | Freq: Once | ORAL | Status: AC
  Administered 2024-07-03: 1 via ORAL
  Filled 2024-07-03: qty 1

## 2024-07-03 NOTE — Discharge Instructions (Signed)
 You were evaluated in the Emergency Department and after careful evaluation, we did not find any emergent condition requiring admission or further testing in the hospital.  Your exam/testing today was overall reassuring.  We drained your abscess here in the emergency department.  Recommend follow-up with your regular doctors, dermatologist.  Continue your doxycycline.  Also prescribing Bactrim .  Tylenol  or Motrin for pain.  Please return to the Emergency Department if you experience any worsening of your condition.  Thank you for allowing us  to be a part of your care.

## 2024-07-03 NOTE — ED Notes (Signed)
 Site dressed and bandaged with dry dressing.

## 2024-07-12 ENCOUNTER — Other Ambulatory Visit: Payer: Self-pay | Admitting: Nurse Practitioner

## 2024-07-23 ENCOUNTER — Other Ambulatory Visit: Payer: Self-pay | Admitting: Nurse Practitioner

## 2024-07-23 DIAGNOSIS — E1165 Type 2 diabetes mellitus with hyperglycemia: Secondary | ICD-10-CM

## 2024-08-07 ENCOUNTER — Other Ambulatory Visit: Payer: Self-pay | Admitting: Nurse Practitioner

## 2024-08-07 DIAGNOSIS — G4700 Insomnia, unspecified: Secondary | ICD-10-CM

## 2024-08-25 ENCOUNTER — Encounter: Payer: Self-pay | Admitting: Nurse Practitioner

## 2024-08-28 ENCOUNTER — Other Ambulatory Visit: Payer: Self-pay

## 2024-08-28 ENCOUNTER — Other Ambulatory Visit: Payer: Self-pay | Admitting: Nurse Practitioner

## 2024-08-28 DIAGNOSIS — Z23 Encounter for immunization: Secondary | ICD-10-CM

## 2024-08-28 MED ORDER — MODERNA COVID-19 VACCINE 100 MCG/0.5ML IM SUSP: 0.5000 mL | Freq: Once | INTRAMUSCULAR | 0 refills | Status: AC

## 2024-08-28 MED ORDER — MODERNA COVID-19 VACCINE 100 MCG/0.5ML IM SUSP: 0.5000 mL | Freq: Once | INTRAMUSCULAR | 0 refills | Status: DC

## 2024-10-10 ENCOUNTER — Encounter: Payer: Self-pay | Admitting: Podiatry

## 2024-10-10 ENCOUNTER — Ambulatory Visit: Admitting: Podiatry

## 2024-10-10 DIAGNOSIS — M79674 Pain in right toe(s): Secondary | ICD-10-CM | POA: Diagnosis not present

## 2024-10-10 DIAGNOSIS — B351 Tinea unguium: Secondary | ICD-10-CM | POA: Diagnosis not present

## 2024-10-10 DIAGNOSIS — Z794 Long term (current) use of insulin: Secondary | ICD-10-CM

## 2024-10-10 DIAGNOSIS — E1165 Type 2 diabetes mellitus with hyperglycemia: Secondary | ICD-10-CM

## 2024-10-10 DIAGNOSIS — M79675 Pain in left toe(s): Secondary | ICD-10-CM

## 2024-10-11 ENCOUNTER — Other Ambulatory Visit: Payer: Self-pay | Admitting: Nurse Practitioner

## 2024-10-11 DIAGNOSIS — E559 Vitamin D deficiency, unspecified: Secondary | ICD-10-CM

## 2024-10-15 NOTE — Progress Notes (Signed)
  Subjective:  Patient ID: Stephen Sampson, male    DOB: 04/13/1961,  MRN: [id number]  Stephen Sampson presents to clinic today for preventative diabetic foot care for painful mycotic toenails x 10 which interfere with daily activities. Pain is relieved with periodic professional debridement.  Chief Complaint  Patient presents with   Diabetes    Patient stated that his last A1c was 8.9 and he last saw his PCP June 20 2024, Patient stated his PCP name is [name]   New problem(s): None.   PCP is Ada Gaines, FNP.  Allergies  Allergen Reactions   Metformin And Related Diarrhea   Review of Systems: Negative except as noted in the HPI.  Objective: No changes noted in today's physical examination. There were no vitals filed for this visit. Stephen Sampson is a pleasant 63 y.o. male in NAD. AAO x 3.  Vascular Examination: Capillary refill time immediate b/l. Vascular status intact b/l with palpable pedal pulses. Pedal hair present b/l. No pain with calf compression b/l. Skin temperature gradient WNL b/l. No cyanosis or clubbing b/l. No ischemia or gangrene noted b/l. No edema noted b/l LE.  Neurological Examination: Sensation grossly intact b/l with 10 gram monofilament. Vibratory sensation intact b/l.   Dermatological Examination: Pedal skin with normal turgor, texture and tone b/l.  No open wounds. No interdigital macerations.   Toenails 1-5 b/l thick, discolored, elongated with subungual debris and pain on dorsal palpation.   No corns, calluses, nor porokeratotic lesions.  Musculoskeletal Examination: Normal muscle strength 5/5 to all lower extremity muscle groups bilaterally. No pain, crepitus or joint limitation noted with ROM b/l LE. No gross bony pedal deformities b/l. Patient ambulates independently without assistive aids.  Radiographs: None  Last A1c:      Latest Ref Rng & Units 06/20/2024    9:02 AM 02/08/2024    8:59 AM  Hemoglobin A1C   Hemoglobin-A1c 4.8 - 5.6 % 8.9  9.7    Assessment/Plan: 1. Pain due to onychomycosis of toenails of both feet   2. Uncontrolled type 2 diabetes mellitus with hyperglycemia (HCC)   Patient was evaluated and treated. All patient's and/or POA's questions/concerns addressed on today's visit. Toenails 1-5 b/l debrided in length and girth without incident. Continue foot and shoe inspections daily. Monitor blood glucose per PCP/Endocrinologist's recommendations. Continue soft, supportive shoe gear daily. Report any pedal injuries to medical professional. Call office if there are any questions/concerns. -Patient/POA to call should there be question/concern in the interim.   No follow-ups on file.  Stephen Sampson, DPM      [place] LOCATION: [address] [place] [address]                   Office [phone]   [facility] LOCATION: [address] [place], [place] [address] Office [phone]

## 2024-10-31 ENCOUNTER — Ambulatory Visit: Admitting: Nurse Practitioner

## 2024-10-31 ENCOUNTER — Encounter: Payer: Self-pay | Admitting: Nurse Practitioner

## 2024-10-31 VITALS — BP 120/64 | HR 68 | Temp 97.5°F | Ht 72.0 in | Wt 252.0 lb

## 2024-10-31 DIAGNOSIS — E1165 Type 2 diabetes mellitus with hyperglycemia: Secondary | ICD-10-CM | POA: Diagnosis not present

## 2024-10-31 DIAGNOSIS — E6609 Other obesity due to excess calories: Secondary | ICD-10-CM | POA: Diagnosis not present

## 2024-10-31 DIAGNOSIS — E559 Vitamin D deficiency, unspecified: Secondary | ICD-10-CM | POA: Diagnosis not present

## 2024-10-31 DIAGNOSIS — E782 Mixed hyperlipidemia: Secondary | ICD-10-CM

## 2024-10-31 DIAGNOSIS — E66811 Obesity, class 1: Secondary | ICD-10-CM | POA: Diagnosis not present

## 2024-10-31 DIAGNOSIS — Z6834 Body mass index (BMI) 34.0-34.9, adult: Secondary | ICD-10-CM

## 2024-10-31 DIAGNOSIS — Z79899 Other long term (current) drug therapy: Secondary | ICD-10-CM | POA: Diagnosis not present

## 2024-10-31 DIAGNOSIS — Z125 Encounter for screening for malignant neoplasm of prostate: Secondary | ICD-10-CM

## 2024-10-31 DIAGNOSIS — I119 Hypertensive heart disease without heart failure: Secondary | ICD-10-CM

## 2024-10-31 DIAGNOSIS — Z0000 Encounter for general adult medical examination without abnormal findings: Secondary | ICD-10-CM | POA: Diagnosis not present

## 2024-10-31 DIAGNOSIS — Z720 Tobacco use: Secondary | ICD-10-CM | POA: Diagnosis not present

## 2024-10-31 LAB — POCT URINALYSIS DIP (CLINITEK)
Bilirubin, UA: NEGATIVE
Blood, UA: NEGATIVE
Glucose, UA: >=1000 mg/dL — AB
Ketones, POC UA: NEGATIVE mg/dL
Leukocytes, UA: NEGATIVE
Nitrite, UA: NEGATIVE
POC PROTEIN,UA: NEGATIVE
Spec Grav, UA: 1.015 (ref 1.010–1.025)
Urobilinogen, UA: 0.2 U/dL
pH, UA: 5.5 (ref 5.0–8.0)

## 2024-10-31 NOTE — Assessment & Plan Note (Signed)
 Increased nocturia since diuretic initiation. - Checked PSA level. - Obtained urine sample for evaluation.

## 2024-10-31 NOTE — Assessment & Plan Note (Signed)
 Will check vitamin D  level and supplement as needed.    Also encouraged to spend 15 minutes in the sun daily.

## 2024-10-31 NOTE — Progress Notes (Signed)
 I,Jameka J Llittleton, CMA,acting as a neurosurgeon for [employer], FNP.,have documented all relevant documentation on the behalf of Stephen Ada, FNP,as directed by  Stephen Ada, FNP while in the presence of Stephen Ada, FNP.  Subjective:   Patient ID: Stephen Sampson , male    DOB: 14-May-1961 , 63 y.o.   MRN: [id number]  Chief Complaint  Patient presents with   Annual Exam    Patient presents today for a physical. Patient reports compliance with his meds. Patient denies having chest pain,sob or headaches at this time.   Discussed the use of AI scribe software for clinical note transcription with the patient, who gave verbal consent to proceed.  History of Present Illness [name] is a 63 year old male who presents for an annual physical exam.  He has a history of hidradenitis suppurativa, with a significant episode in July when he developed a painful abscess on his thigh, approximately the size of a lemon, which required incision and drainage at the emergency room. A follow-up appointment with his dermatologist is scheduled for next month for an annual skin check and review of his condition.  He is current on his COVID and flu vaccinations, having received the COVID vaccine in early September and the flu vaccine in October. He obtained the flu vaccine from [facility] and has the record at home.  His weight is stable at 257 pounds, consistent for the past ten years. He is conscious of portion control, cooks most meals at home, and eats out twice a month. He avoids keeping snacks in his office to prevent overeating.  He is retired and spends his time surfing the internet, shopping, and attending doctor's appointments. He does not engage in regular exercise but is considering options to increase physical activity.  He smokes one pack of cigarettes per day and has attempted to quit using Chantix  and Wellbutrin in the past without success. He is considering quitting more seriously  after a recent visit with a friend who quit smoking.  He reports increased urination frequency since starting a diuretic, particularly at night, needing to urinate two to three times per night. He also notes frequent urination during long drives, which he attributes to fluid intake during travel. He has not noticed any problems with urination beyond the increased frequency.   Hypertension This is a chronic problem. The current episode started more than 1 year ago. The problem has been gradually improving since onset. The problem is uncontrolled. Pertinent negatives include no anxiety, blurred vision, chest pain, headaches or palpitations. There are no associated agents to hypertension. There are no known risk factors for coronary artery disease. Past treatments include angiotensin blockers and alpha 1 blockers. There are no compliance problems.  There is no history of angina. There is no history of chronic renal disease.  Diabetes He presents for his follow-up diabetic visit. He has type 2 diabetes mellitus. His disease course has been improving. Pertinent negatives for hypoglycemia include no dizziness or headaches. Pertinent negatives for diabetes include no blurred vision, no chest pain and no fatigue. There are no hypoglycemic complications. Symptoms are stable. There are no diabetic complications. Risk factors for coronary artery disease include obesity, sedentary lifestyle, male sex, diabetes mellitus and dyslipidemia. Current diabetic treatment includes oral agent (dual therapy). He is compliant with treatment all of the time. His weight is increasing steadily. He is following a generally healthy diet. When asked about meal planning, he reported none. He has not had a previous visit with  a dietitian. He participates in exercise intermittently. (Blood sugar this morning was 150-175 in am. He is eating approximately 2 meals a day.) An ACE inhibitor/angiotensin II receptor blocker is being taken. He does  not see a podiatrist.Eye exam is current (01/18/2024 last appt).     Past Medical History:  Diagnosis Date   Cataract 2007   Class 2 obesity due to excess calories with body mass index (BMI) of 35.0 to 35.9 in adult 10/13/2023   Diabetes mellitus without complication (HCC)    Heart murmur 2017   Hidradenitis    Hypertension    Sleep apnea 1998     Family History  Problem Relation Age of Onset   Sudden death Mother        Possible MI   Cancer Mother    Diabetes Mother    Heart disease Mother    Obesity Mother    Dementia Father    Diabetes Maternal Grandfather      Current Outpatient Medications:    amLODipine  (NORVASC ) 10 MG tablet, Take 1 tablet (10 mg total) by mouth daily., Disp: 90 tablet, Rfl: 1   aspirin EC 81 MG tablet, Take 81 mg by mouth daily., Disp: , Rfl:    atorvastatin (LIPITOR) 40 MG tablet, TAKE 1 TABLET EVERY EVENING, Disp: 90 tablet, Rfl: 3   clindamycin (CLEOCIN T) 1 % lotion, Apply  as directed to affected area twice a day, Disp: , Rfl:    doxycycline (VIBRAMYCIN) 50 MG capsule, Take 50 mg by mouth daily., Disp: , Rfl:    glucose blood (FREESTYLE LITE) test strip, USE AS INSTRUCTED, Disp: 300 strip, Rfl: 3   HUMIRA, 2 PEN, 80 MG/0.8ML PNKT, Inject 1 each as directed every 14 (fourteen) days., Disp: , Rfl:    hydrochlorothiazide  (HYDRODIURIL ) 25 MG tablet, TAKE 1 TABLET DAILY, Disp: 90 tablet, Rfl: 3   insulin  lispro (HUMALOG ) 100 UNIT/ML KwikPen, INJECT UNDER THE SKIN BEFORE MEALS AS PER INSULIN  SLIDING SCALE PROTOCOL, Disp: 45 mL, Rfl: 3   Insulin  Pen Needle (B-D UF III MINI PEN NEEDLES) 31G X 5 MM MISC, Use as directed, Disp: 100 each, Rfl: 3   JARDIANCE  25 MG TABS tablet, TAKE 1 TABLET DAILY BEFORE BREAKFAST, Disp: 90 tablet, Rfl: 3   LANTUS  SOLOSTAR 100 UNIT/ML Solostar Pen, INJECT 50 UNITS UNDER THE SKIN AT BEDTIME, Disp: 45 mL, Rfl: 3   OZEMPIC , 2 MG/DOSE, 8 MG/3ML SOPN, INJECT 2 MG UNDER THE SKIN WEEKLY, Disp: 9 mL, Rfl: 3   temazepam  (RESTORIL ) 15  MG capsule, TAKE 1 CAPSULE BY MOUTH EVERY NIGHT AT BEDTIME AS NEEDED FOR SLEEP, Disp: 30 capsule, Rfl: 5   valsartan  (DIOVAN ) 320 MG tablet, TAKE 1 TABLET DAILY, Disp: 90 tablet, Rfl: 3   Vitamin D , Ergocalciferol , (DRISDOL ) 1.25 MG (50000 UNIT) CAPS capsule, TAKE 1 CAPSULE TWICE A WEEK, Disp: 24 capsule, Rfl: 3   Allergies  Allergen Reactions   Metformin And Related Diarrhea     Men's preventive visit. Patient Health Questionnaire (PHQ-2) is  Flowsheet Row Office Visit from 10/31/2024 in [facility]  PHQ-2 Total Score 0  Patient is on a Regular diet; breakfast or lunch and dinner; conscious of portion control.  Exercise: minimal. Marital status: Widowed. Relevant history for alcohol use is:  Social History   Substance and Sexual Activity  Alcohol Use Yes   Comment: occ    Relevant history for tobacco use is:  Social History   Tobacco Use  Smoking Status Every  Day   Current packs/day: 1.00   Average packs/day: 1 pack/day for 43.0 years (43.0 ttl pk-yrs)   Types: Cigarettes  Smokeless Tobacco Current  Tobacco Comments   He has not quit smoking at this time; he is not smoking in the car and in the house.    Review of Systems  Constitutional: Negative.  Negative for fatigue.  HENT: Negative.    Eyes: Negative.  Negative for blurred vision.  Respiratory: Negative.    Cardiovascular:  Negative for chest pain, palpitations and leg swelling.  Gastrointestinal: Negative.   Endocrine: Negative.   Genitourinary: Negative.   Musculoskeletal: Negative.   Skin: Negative.   Neurological: Negative.  Negative for dizziness and headaches.  Hematological: Negative.   Psychiatric/Behavioral: Negative.       Today's Vitals   10/31/24 0852  BP: 120/64  Pulse: 68  Temp: (!) 97.5 F (36.4 C)  TempSrc: Oral  Weight: 252 lb (114.3 kg)  Height: 6' (1.829 m)  PainSc: 0-No pain   Body mass index is 34.18 kg/m.  Wt Readings from Last 3 Encounters:   10/31/24 252 lb (114.3 kg)  07/02/24 254 lb (115.2 kg)  06/20/24 254 lb (115.2 kg)    Objective:  Physical Exam Vitals and nursing note reviewed.  Constitutional:      General: He is not in acute distress.    Appearance: Normal appearance. He is obese.  HENT:     Head: Normocephalic and atraumatic.     Right Ear: Tympanic membrane, ear canal and external ear normal. There is no impacted cerumen.     Left Ear: Tympanic membrane, ear canal and external ear normal. There is no impacted cerumen.     Nose: Nose normal.     Mouth/Throat:     Mouth: Mucous membranes are moist.  Eyes:     Extraocular Movements: Extraocular movements intact.     Conjunctiva/sclera: Conjunctivae normal.     Pupils: Pupils are equal, round, and reactive to light.  Cardiovascular:     Rate and Rhythm: Normal rate and regular rhythm.     Pulses: Normal pulses.     Heart sounds: Normal heart sounds. No murmur heard. Pulmonary:     Effort: Pulmonary effort is normal. No respiratory distress.     Breath sounds: Normal breath sounds. No wheezing.  Abdominal:     General: Abdomen is flat. Bowel sounds are normal. There is no distension.     Palpations: Abdomen is soft.     Tenderness: There is no abdominal tenderness.  Genitourinary:    Comments: Deferred - will check PSA Musculoskeletal:        General: No swelling or tenderness. Normal range of motion.     Cervical back: Normal range of motion and neck supple. No tenderness.  Skin:    General: Skin is warm and dry.     Capillary Refill: Capillary refill takes less than 2 seconds.     Coloration: Skin is not jaundiced.  Neurological:     General: No focal deficit present.     Mental Status: He is alert and oriented to person, place, and time.     Cranial Nerves: No cranial nerve deficit.     Motor: No weakness.  Psychiatric:        Mood and Affect: Mood normal.        Behavior: Behavior normal.        Thought Content: Thought content normal.         Judgment: Judgment normal.  Assessment And Plan:    Encounter for annual physical exam Assessment & Plan: Routine visit with stable weight and vaccinations up to date. - Encouraged regular exercise and social activities. - Continue current diet and portion control. - Discussed smoking cessation resources.   Mixed hyperlipidemia Assessment & Plan: Cholesterol levels remain stable, continue statin, tolerating well.  Orders: -     Lipid panel  Uncontrolled type 2 diabetes mellitus with hyperglycemia (HCC) Assessment & Plan: Chronic, A1c was decreased to 8.9 at last visit. Continue current medications.   Orders: -     Hemoglobin A1c  Other long term (current) drug therapy -     CBC  Vitamin D  deficiency Assessment & Plan: Will check vitamin D  level and supplement as needed.    Also encouraged to spend 15 minutes in the sun daily.    Orders: -     VITAMIN D  25 Hydroxy (Vit-D Deficiency, Fractures)  Class 1 obesity due to excess calories without serious comorbidity with body mass index (BMI) of 34.0 to 34.9 in adult Assessment & Plan: He is encouraged to strive for BMI less than 30 to decrease cardiac risk. Advised to aim for at least 150 minutes of exercise per week.    Tobacco abuse Assessment & Plan: Continues smoking one pack per day, considering quitting. - Discussed smoking cessation resources.  Orders: -     CT CHEST LUNG CANCER SCREENING LOW DOSE WO CONTRAST; Future  Hypertensive heart disease without heart failure Assessment & Plan: Blood pressure is well controlled, continue current medications and f/u with Cardiology  Orders: -     POCT URINALYSIS DIP (CLINITEK) -     Microalbumin / creatinine urine ratio -     EKG 12-Lead -     CMP14+EGFR  Encounter for prostate cancer screening Assessment & Plan: Increased nocturia since diuretic initiation. - Checked PSA level. - Obtained urine sample for evaluation.  Orders: -     PSA    Return  for 1 year physical. Patient was given opportunity to ask questions. Patient verbalized understanding of the plan and was able 4to repeat key elements of the plan. All questions were answered to their satisfaction.   Stephen Ada, FNP  I, Stephen Ada, FNP, have reviewed all documentation for this visit. The documentation on 10/31/24 for the exam, diagnosis, procedures, and orders are all accurate and complete.

## 2024-10-31 NOTE — Assessment & Plan Note (Signed)
 Chronic, A1c was decreased to 8.9 at last visit. Continue current medications.

## 2024-10-31 NOTE — Patient Instructions (Addendum)
 Health Maintenance, Male Adopting a healthy lifestyle and getting preventive care are important in promoting health and wellness. Ask your health care provider about: The right schedule for you to have regular tests and exams. Things you can do on your own to prevent diseases and keep yourself healthy. What should I know about diet, weight, and exercise? Eat a healthy diet  Eat a diet that includes plenty of vegetables, fruits, low-fat dairy products, and lean protein. Do not eat a lot of foods that are high in solid fats, added sugars, or sodium. Maintain a healthy weight Body mass index (BMI) is a measurement that can be used to identify possible weight problems. It estimates body fat based on height and weight. Your health care provider can help determine your BMI and help you achieve or maintain a healthy weight. Get regular exercise Get regular exercise. This is one of the most important things you can do for your health. Most adults should: Exercise for at least 150 minutes each week. The exercise should increase your heart rate and make you sweat (moderate-intensity exercise). Do strengthening exercises at least twice a week. This is in addition to the moderate-intensity exercise. Spend less time sitting. Even light physical activity can be beneficial. Watch cholesterol and blood lipids Have your blood tested for lipids and cholesterol at 64 years of age, then have this test every 5 years. You may need to have your cholesterol levels checked more often if: Your lipid or cholesterol levels are high. You are older than 64 years of age. You are at high risk for heart disease. What should I know about cancer screening? Many types of cancers can be detected early and may often be prevented. Depending on your health history and family history, you may need to have cancer screening at various ages. This may include screening for: Colorectal cancer. Prostate cancer. Skin cancer. Lung  cancer. What should I know about heart disease, diabetes, and high blood pressure? Blood pressure and heart disease High blood pressure causes heart disease and increases the risk of stroke. This is more likely to develop in people who have high blood pressure readings or are overweight. Talk with your health care provider about your target blood pressure readings. Have your blood pressure checked: Every 3-5 years if you are 79-25 years of age. Every year if you are 22 years old or older. If you are between the ages of 10 and 74 and are a current or former smoker, ask your health care provider if you should have a one-time screening for abdominal aortic aneurysm (AAA). Diabetes Have regular diabetes screenings. This checks your fasting blood sugar level. Have the screening done: Once every three years after age 35 if you are at a normal weight and have a low risk for diabetes. More often and at a younger age if you are overweight or have a high risk for diabetes. What should I know about preventing infection? Hepatitis B If you have a higher risk for hepatitis B, you should be screened for this virus. Talk with your health care provider to find out if you are at risk for hepatitis B infection. Hepatitis C Blood testing is recommended for: Everyone born from [birth year] through 1966. Anyone with known risk factors for hepatitis C. Sexually transmitted infections (STIs) You should be screened each year for STIs, including gonorrhea and chlamydia, if: You are sexually active and are younger than 64 years of age. You are older than 64 years of age and your  health care provider tells you that you are at risk for this type of infection. Your sexual activity has changed since you were last screened, and you are at increased risk for chlamydia or gonorrhea. Ask your health care provider if you are at risk. Ask your health care provider about whether you are at high risk for HIV. Your health care provider  may recommend a prescription medicine to help prevent HIV infection. If you choose to take medicine to prevent HIV, you should first get tested for HIV. You should then be tested every 3 months for as long as you are taking the medicine. Follow these instructions at home: Alcohol use Do not drink alcohol if your health care provider tells you not to drink. If you drink alcohol: Limit how much you have to 0-2 drinks a day. Know how much alcohol is in your drink. In the [place] one drink equals one 12 oz bottle of beer (355 mL), one 5 oz glass of wine (148 mL), or one 1 oz glass of hard liquor (44 mL). Lifestyle Do not use any products that contain nicotine or tobacco. These products include cigarettes, chewing tobacco, and vaping devices, such as e-cigarettes. If you need help quitting, ask your health care provider. Do not use street drugs. Do not share needles. Ask your health care provider for help if you need support or information about quitting drugs. General instructions Schedule regular health, dental, and eye exams. Stay current with your vaccines. Tell your health care provider if: You often feel depressed. You have ever been abused or do not feel safe at home. Summary Adopting a healthy lifestyle and getting preventive care are important in promoting health and wellness. Follow your health care provider's instructions about healthy diet, exercising, and getting tested or screened for diseases. Follow your health care provider's instructions on monitoring your cholesterol and blood pressure. This information is not intended to replace advice given to you by your health care provider. Make sure you discuss any questions you have with your health care provider. Health Maintenance  Topic Date Due   Flu Shot  07/21/2024   Screening for Lung Cancer  08/04/2024   COVID-19 Vaccine (7 - 2025-26 season) 08/21/2024   Yearly kidney health urinalysis for diabetes  10/28/2024   Hemoglobin A1C   12/21/2024   Eye exam for diabetics  01/17/2025   Complete foot exam   03/09/2025   Yearly kidney function blood test for diabetes  06/20/2025   DTaP/Tdap/Td vaccine (2 - Td or Tdap) 07/13/2026   Colon Cancer Screening  09/03/2029   Pneumococcal Vaccine for age over 61  Completed   Hepatitis C Screening  Completed   HIV Screening  Completed   Zoster (Shingles) Vaccine  Completed   Hepatitis B Vaccine  Aged Out   HPV Vaccine  Aged Out   Meningitis B Vaccine  Aged Out    Document Revised: 04/28/2021 Document Reviewed: 04/28/2021 Elsevier Patient Education  2024 [employer].

## 2024-10-31 NOTE — Assessment & Plan Note (Signed)
 Cholesterol levels remain stable, continue statin, tolerating well.

## 2024-10-31 NOTE — Assessment & Plan Note (Signed)
 Routine visit with stable weight and vaccinations up to date. - Encouraged regular exercise and social activities. - Continue current diet and portion control. - Discussed smoking cessation resources.

## 2024-10-31 NOTE — Assessment & Plan Note (Signed)
 Blood pressure is well controlled, continue current medications and f/u with Cardiology

## 2024-10-31 NOTE — Assessment & Plan Note (Signed)
 Continues smoking one pack per day, considering quitting. - Discussed smoking cessation resources.

## 2024-10-31 NOTE — Assessment & Plan Note (Signed)
 He is encouraged to strive for BMI less than 30 to decrease cardiac risk. Advised to aim for at least 150 minutes of exercise per week.

## 2024-11-01 LAB — CMP14+EGFR
ALT: 22 IU/L (ref 0–44)
AST: 17 IU/L (ref 0–40)
Albumin: 4.2 g/dL (ref 3.9–4.9)
Alkaline Phosphatase: 74 IU/L (ref 47–123)
BUN/Creatinine Ratio: 17 (ref 10–24)
BUN: 19 mg/dL (ref 8–27)
Bilirubin Total: 0.4 mg/dL (ref 0.0–1.2)
CO2: 19 mmol/L — ABNORMAL LOW (ref 20–29)
Calcium: 9.6 mg/dL (ref 8.6–10.2)
Chloride: 102 mmol/L (ref 96–106)
Creatinine, Ser: 1.14 mg/dL (ref 0.76–1.27)
Globulin, Total: 3.1 g/dL (ref 1.5–4.5)
Glucose: 159 mg/dL — ABNORMAL HIGH (ref 70–99)
Potassium: 3.8 mmol/L (ref 3.5–5.2)
Sodium: 137 mmol/L (ref 134–144)
Total Protein: 7.3 g/dL (ref 6.0–8.5)
eGFR: 72 mL/min/1.73 (ref >59)

## 2024-11-01 LAB — CBC
Hematocrit: 50.1 % (ref 37.5–51.0)
Hemoglobin: 16.5 g/dL (ref 13.0–17.7)
MCH: 28.9 pg (ref 26.6–33.0)
MCHC: 32.9 g/dL (ref 31.5–35.7)
MCV: 88 fL (ref 79–97)
Platelets: 217 x10E3/uL (ref 150–450)
RBC: 5.71 x10E6/uL (ref 4.14–5.80)
RDW: 13.8 % (ref 11.6–15.4)
WBC: 8.7 x10E3/uL (ref 3.4–10.8)

## 2024-11-01 LAB — MICROALBUMIN / CREATININE URINE RATIO
Creatinine, Urine: 80.4 mg/dL
Microalb/Creat Ratio: <4 mg/g{creat} (ref 0–29)
Microalbumin, Urine: <3 ug/mL

## 2024-11-01 LAB — VITAMIN D 25 HYDROXY (VIT D DEFICIENCY, FRACTURES): Vit D, 25-Hydroxy: 78.7 ng/mL (ref 30.0–100.0)

## 2024-11-01 LAB — LIPID PANEL
Chol/HDL Ratio: 4.6 ratio (ref 0.0–5.0)
Cholesterol, Total: 115 mg/dL (ref 100–199)
HDL: 25 mg/dL — ABNORMAL LOW (ref >39)
LDL Chol Calc (NIH): 54 mg/dL (ref 0–99)
Triglycerides: 223 mg/dL — ABNORMAL HIGH (ref 0–149)
VLDL Cholesterol Cal: 36 mg/dL (ref 5–40)

## 2024-11-01 LAB — HEMOGLOBIN A1C
Est. average glucose Bld gHb Est-mCnc: 212 mg/dL
Hgb A1c MFr Bld: 9 % — ABNORMAL HIGH (ref 4.8–5.6)

## 2024-11-01 LAB — PSA: Prostate Specific Ag, Serum: 0.3 ng/mL (ref 0.0–4.0)

## 2024-11-06 ENCOUNTER — Ambulatory Visit: Payer: Self-pay | Admitting: Nurse Practitioner

## 2024-11-21 ENCOUNTER — Encounter: Payer: Self-pay | Admitting: Nurse Practitioner

## 2024-11-21 ENCOUNTER — Ambulatory Visit: Payer: Self-pay | Admitting: Nurse Practitioner

## 2024-12-19 ENCOUNTER — Other Ambulatory Visit: Payer: Self-pay | Admitting: Nurse Practitioner

## 2025-01-07 ENCOUNTER — Other Ambulatory Visit: Payer: Self-pay | Admitting: Nurse Practitioner

## 2025-01-18 LAB — OPHTHALMOLOGY REPORT-SCANNED

## 2025-01-23 ENCOUNTER — Ambulatory Visit (INDEPENDENT_AMBULATORY_CARE_PROVIDER_SITE_OTHER): Admitting: Podiatry

## 2025-01-23 ENCOUNTER — Encounter: Payer: Self-pay | Admitting: Podiatry

## 2025-01-23 DIAGNOSIS — E1165 Type 2 diabetes mellitus with hyperglycemia: Secondary | ICD-10-CM

## 2025-01-23 DIAGNOSIS — M79674 Pain in right toe(s): Secondary | ICD-10-CM

## 2025-01-23 DIAGNOSIS — B351 Tinea unguium: Secondary | ICD-10-CM

## 2025-01-23 DIAGNOSIS — M79675 Pain in left toe(s): Secondary | ICD-10-CM

## 2025-01-23 NOTE — Progress Notes (Signed)
"  °  Subjective:  Patient ID: Stephen Sampson, male    DOB: 04-19-1961,  MRN: [id number]  Stephen Sampson presents to clinic today for preventative diabetic foot care for painful thick toenails that are difficult to trim. Pain interferes with ambulation. Aggravating factors include wearing enclosed shoe gear. Pain is relieved with periodic professional debridement.  Chief Complaint  Patient presents with   Diabetes    DFC IDDM A1C 9.0. Toenail trim. LOV with PCP 10/31/24.   New problem(s): None.   PCP is Georgina Speaks, FNP.  Allergies[1]  Review of Systems: Negative except as noted in the HPI.  Objective: No changes noted in today's physical examination. There were no vitals filed for this visit. Stephen Sampson is a pleasant 64 y.o. male in NAD. AAO x 3.  Vascular Examination: Capillary refill time immediate b/l. Vascular status intact b/l with palpable pedal pulses. Pedal hair present b/l. No pain with calf compression b/l. Skin temperature gradient WNL b/l. No cyanosis or clubbing b/l. No ischemia or gangrene noted b/l. No edema noted b/l LE.  Neurological Examination: Sensation grossly intact b/l with 10 gram monofilament. Vibratory sensation intact b/l.   Dermatological Examination: Pedal skin with normal turgor, texture and tone b/l.  No open wounds. No interdigital macerations.   Toenails 1-5 b/l thick, discolored, elongated with subungual debris and pain on dorsal palpation.   No corns, calluses, nor porokeratotic lesions.  Musculoskeletal Examination: Normal muscle strength 5/5 to all lower extremity muscle groups bilaterally. No pain, crepitus or joint limitation noted with ROM b/l LE. No gross bony pedal deformities b/l. Patient ambulates independently without assistive aids.  Radiographs: None  Assessment/Plan: 1. Pain due to onychomycosis of toenails of both feet   2. Uncontrolled type 2 diabetes mellitus with hyperglycemia [facility])   Consent given for  treatment. Patient examined. All patient's and/or POA's questions/concerns addressed on today's visit. Mycotic toenails 1-5 b/l debrided in length and girth without incident. Continue foot and shoe inspections daily. Monitor blood glucose per PCP/Endocrinologist's recommendations.Continue soft, supportive shoe gear daily. Report any pedal injuries to medical professional. Call office if there are any quesitons/concerns. -Patient/POA to call should there be question/concern in the interim.   No follow-ups on file.  Delon LITTIE Merlin, DPM      [place] LOCATION: [address] [place] [address]                   Office [phone]   [place] LOCATION: [address] [place], [place] [address] Office [phone]      [1]  Allergies Allergen Reactions   Metformin And Related Diarrhea   "

## 2025-02-06 ENCOUNTER — Ambulatory Visit: Admitting: Nurse Practitioner

## 2025-04-25 ENCOUNTER — Ambulatory Visit: Admitting: Podiatry

## 2025-05-08 ENCOUNTER — Ambulatory Visit: Admitting: Cardiology

## 2025-11-01 ENCOUNTER — Encounter: Admitting: Nurse Practitioner
